# Patient Record
Sex: Female | Born: 1960 | Race: Black or African American | Hispanic: No | Marital: Married | Smoking: Never smoker
Health system: Southern US, Community
[De-identification: ages and names within clinical notes are randomized; demographics above are authoritative.]

## PROBLEM LIST (undated history)

## (undated) DIAGNOSIS — M5136 Other intervertebral disc degeneration, lumbar region: Secondary | ICD-10-CM

## (undated) DIAGNOSIS — K635 Polyp of colon: Secondary | ICD-10-CM

## (undated) DIAGNOSIS — E0590 Thyrotoxicosis, unspecified without thyrotoxic crisis or storm: Secondary | ICD-10-CM

## (undated) DIAGNOSIS — E785 Hyperlipidemia, unspecified: Secondary | ICD-10-CM

## (undated) HISTORY — DX: Hyperlipidemia, unspecified: E78.5

## (undated) HISTORY — DX: Polyp of colon: K63.5

## (undated) HISTORY — PX: CHOLECYSTECTOMY: SHX55

## (undated) HISTORY — DX: Thyrotoxicosis, unspecified without thyrotoxic crisis or storm: E05.90

## (undated) HISTORY — DX: Other intervertebral disc degeneration, lumbar region: M51.36

## (undated) HISTORY — PX: TUBAL LIGATION: SHX77

---

## 1998-05-23 ENCOUNTER — Other Ambulatory Visit: Admission: RE | Admit: 1998-05-23 | Discharge: 1998-05-23 | Payer: Self-pay | Admitting: *Deleted

## 1999-05-08 ENCOUNTER — Ambulatory Visit (HOSPITAL_COMMUNITY): Admission: RE | Admit: 1999-05-08 | Discharge: 1999-05-08 | Payer: Self-pay | Admitting: *Deleted

## 1999-05-08 ENCOUNTER — Encounter: Payer: Self-pay | Admitting: *Deleted

## 1999-05-16 ENCOUNTER — Other Ambulatory Visit: Admission: RE | Admit: 1999-05-16 | Discharge: 1999-05-16 | Payer: Self-pay | Admitting: *Deleted

## 1999-12-03 ENCOUNTER — Ambulatory Visit (HOSPITAL_COMMUNITY): Admission: RE | Admit: 1999-12-03 | Discharge: 1999-12-03 | Payer: Self-pay | Admitting: Family Medicine

## 2000-01-03 ENCOUNTER — Other Ambulatory Visit: Admission: RE | Admit: 2000-01-03 | Discharge: 2000-01-03 | Payer: Self-pay | Admitting: *Deleted

## 2000-01-28 ENCOUNTER — Ambulatory Visit: Admission: AD | Admit: 2000-01-28 | Discharge: 2000-01-28 | Payer: Self-pay | Admitting: *Deleted

## 2000-01-28 ENCOUNTER — Encounter (INDEPENDENT_AMBULATORY_CARE_PROVIDER_SITE_OTHER): Payer: Self-pay | Admitting: *Deleted

## 2000-01-28 ENCOUNTER — Encounter: Payer: Self-pay | Admitting: *Deleted

## 2000-04-10 ENCOUNTER — Ambulatory Visit (HOSPITAL_COMMUNITY): Admission: RE | Admit: 2000-04-10 | Discharge: 2000-04-10 | Payer: Self-pay | Admitting: *Deleted

## 2001-02-26 ENCOUNTER — Ambulatory Visit (HOSPITAL_COMMUNITY): Admission: RE | Admit: 2001-02-26 | Discharge: 2001-02-26 | Payer: Self-pay | Admitting: *Deleted

## 2001-02-26 ENCOUNTER — Encounter: Payer: Self-pay | Admitting: *Deleted

## 2002-02-28 ENCOUNTER — Encounter: Payer: Self-pay | Admitting: *Deleted

## 2002-02-28 ENCOUNTER — Ambulatory Visit (HOSPITAL_COMMUNITY): Admission: RE | Admit: 2002-02-28 | Discharge: 2002-02-28 | Payer: Self-pay | Admitting: *Deleted

## 2003-03-16 ENCOUNTER — Encounter: Payer: Self-pay | Admitting: Family Medicine

## 2003-03-16 ENCOUNTER — Ambulatory Visit (HOSPITAL_COMMUNITY): Admission: RE | Admit: 2003-03-16 | Discharge: 2003-03-16 | Payer: Self-pay | Admitting: Family Medicine

## 2004-04-10 ENCOUNTER — Ambulatory Visit (HOSPITAL_COMMUNITY): Admission: RE | Admit: 2004-04-10 | Discharge: 2004-04-10 | Payer: Self-pay | Admitting: Family Medicine

## 2005-03-25 ENCOUNTER — Ambulatory Visit (HOSPITAL_COMMUNITY): Admission: RE | Admit: 2005-03-25 | Discharge: 2005-03-25 | Payer: Self-pay | Admitting: Family Medicine

## 2005-04-14 ENCOUNTER — Encounter: Admission: RE | Admit: 2005-04-14 | Discharge: 2005-04-14 | Payer: Self-pay | Admitting: Surgery

## 2005-05-22 ENCOUNTER — Encounter (INDEPENDENT_AMBULATORY_CARE_PROVIDER_SITE_OTHER): Payer: Self-pay | Admitting: Specialist

## 2005-05-22 ENCOUNTER — Ambulatory Visit (HOSPITAL_COMMUNITY): Admission: RE | Admit: 2005-05-22 | Discharge: 2005-05-23 | Payer: Self-pay | Admitting: Surgery

## 2005-06-04 ENCOUNTER — Ambulatory Visit (HOSPITAL_COMMUNITY): Admission: RE | Admit: 2005-06-04 | Discharge: 2005-06-04 | Payer: Self-pay | Admitting: Family Medicine

## 2005-07-30 ENCOUNTER — Other Ambulatory Visit: Admission: RE | Admit: 2005-07-30 | Discharge: 2005-07-30 | Payer: Self-pay | Admitting: Family Medicine

## 2005-08-18 ENCOUNTER — Encounter: Admission: RE | Admit: 2005-08-18 | Discharge: 2005-08-18 | Payer: Self-pay | Admitting: Family Medicine

## 2006-03-20 DIAGNOSIS — D259 Leiomyoma of uterus, unspecified: Secondary | ICD-10-CM | POA: Insufficient documentation

## 2006-05-07 ENCOUNTER — Ambulatory Visit: Payer: Self-pay | Admitting: Family Medicine

## 2006-08-28 ENCOUNTER — Other Ambulatory Visit: Admission: RE | Admit: 2006-08-28 | Discharge: 2006-08-28 | Payer: Self-pay | Admitting: Family Medicine

## 2006-09-03 ENCOUNTER — Encounter: Admission: RE | Admit: 2006-09-03 | Discharge: 2006-09-03 | Payer: Self-pay | Admitting: Family Medicine

## 2006-11-01 IMAGING — CR DG SHOULDER 2+V*L*
3 series · 3 of 3 positions shown · non-contrast
Comparison: none

CLINICAL DATA: Pain in both shoulders.
 RIGHT SHOULDER ? 3 VIEW:
 There is no evidence of fracture or dislocation.  There is no evidence of arthropathy or other focal bone abnormality.  Soft tissues are unremarkable.

[view not recorded (1 of 3)]
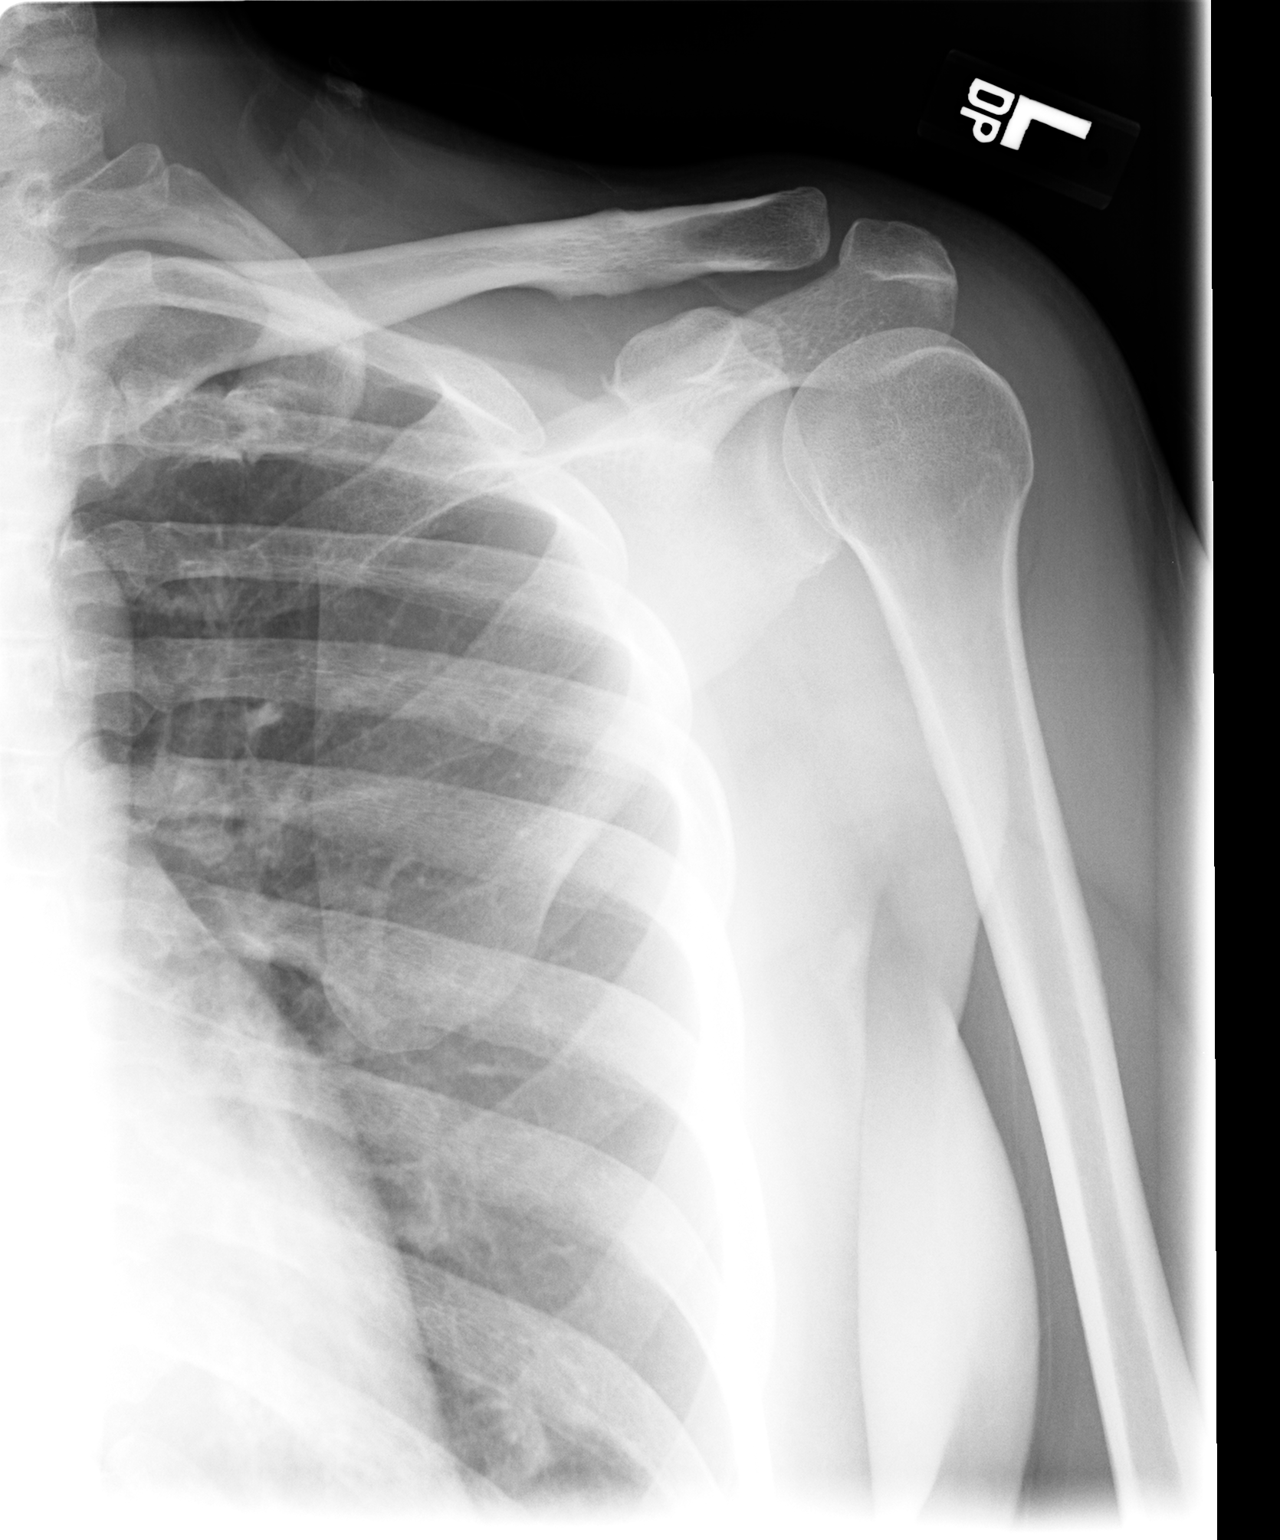

[view not recorded (2 of 3)]
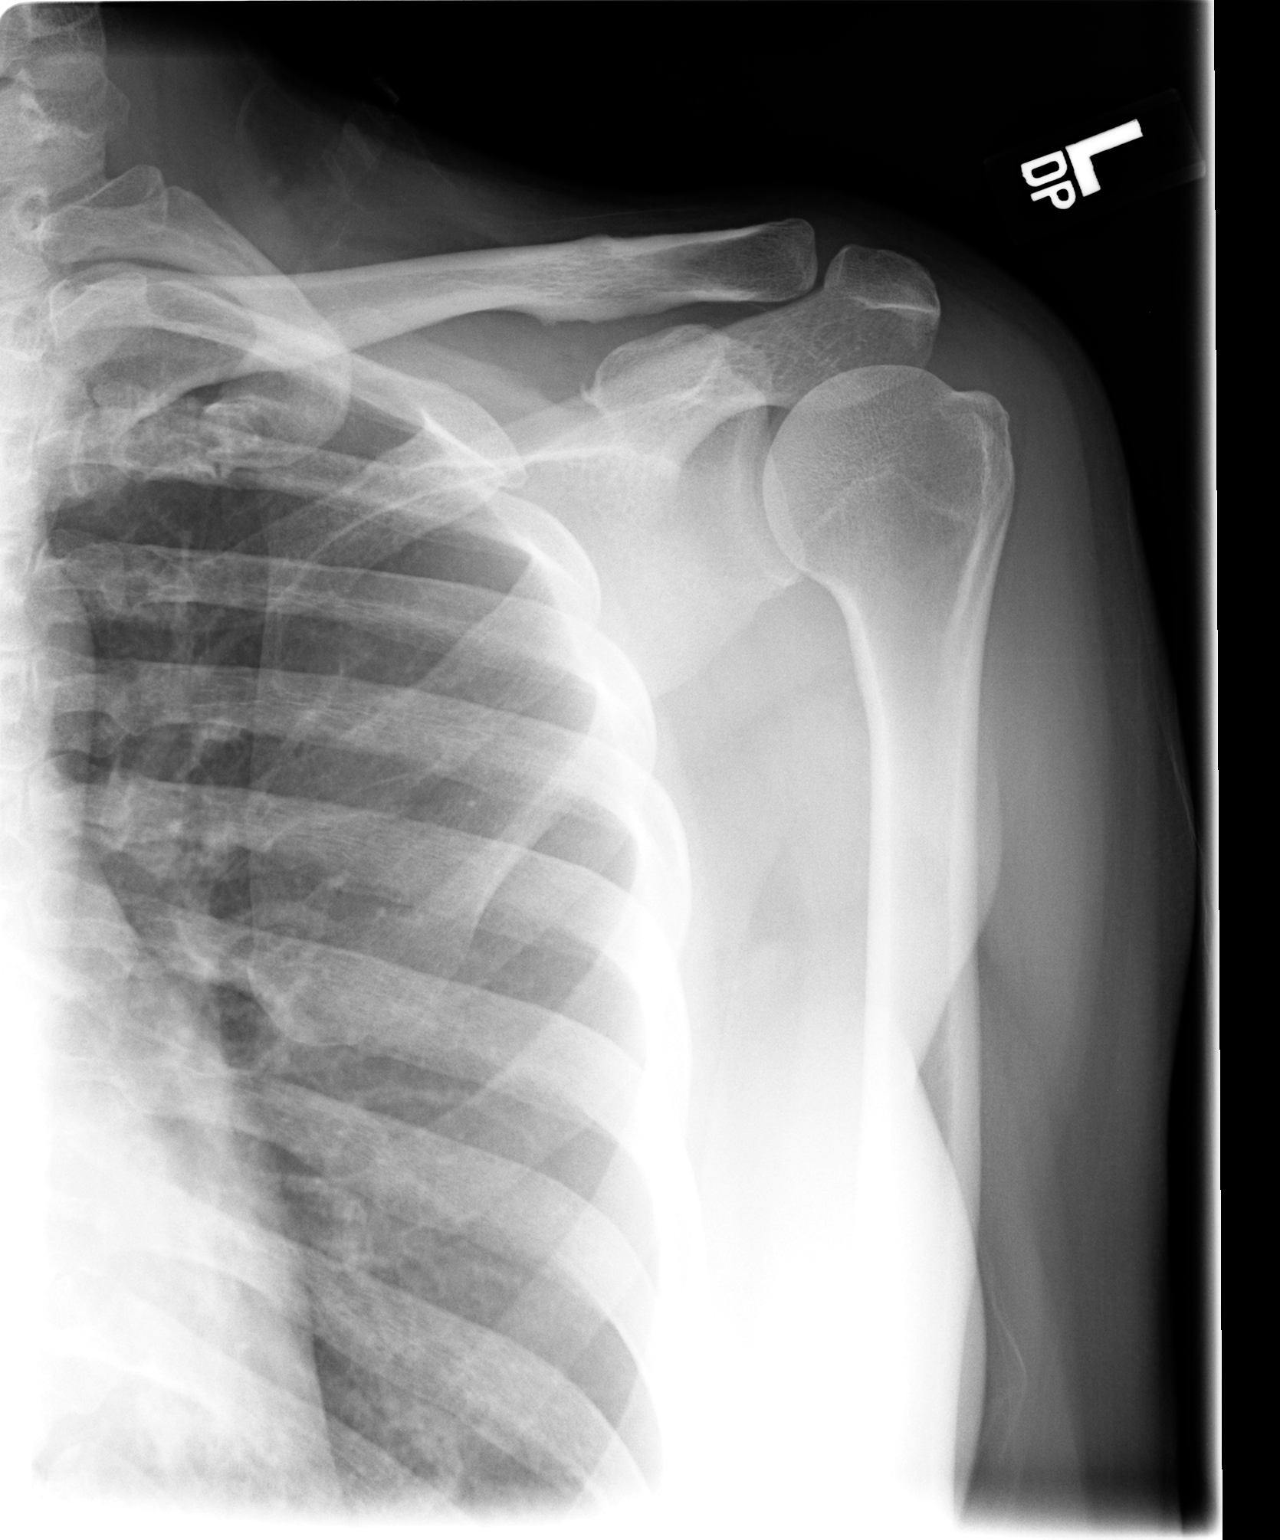

[view not recorded (3 of 3)]
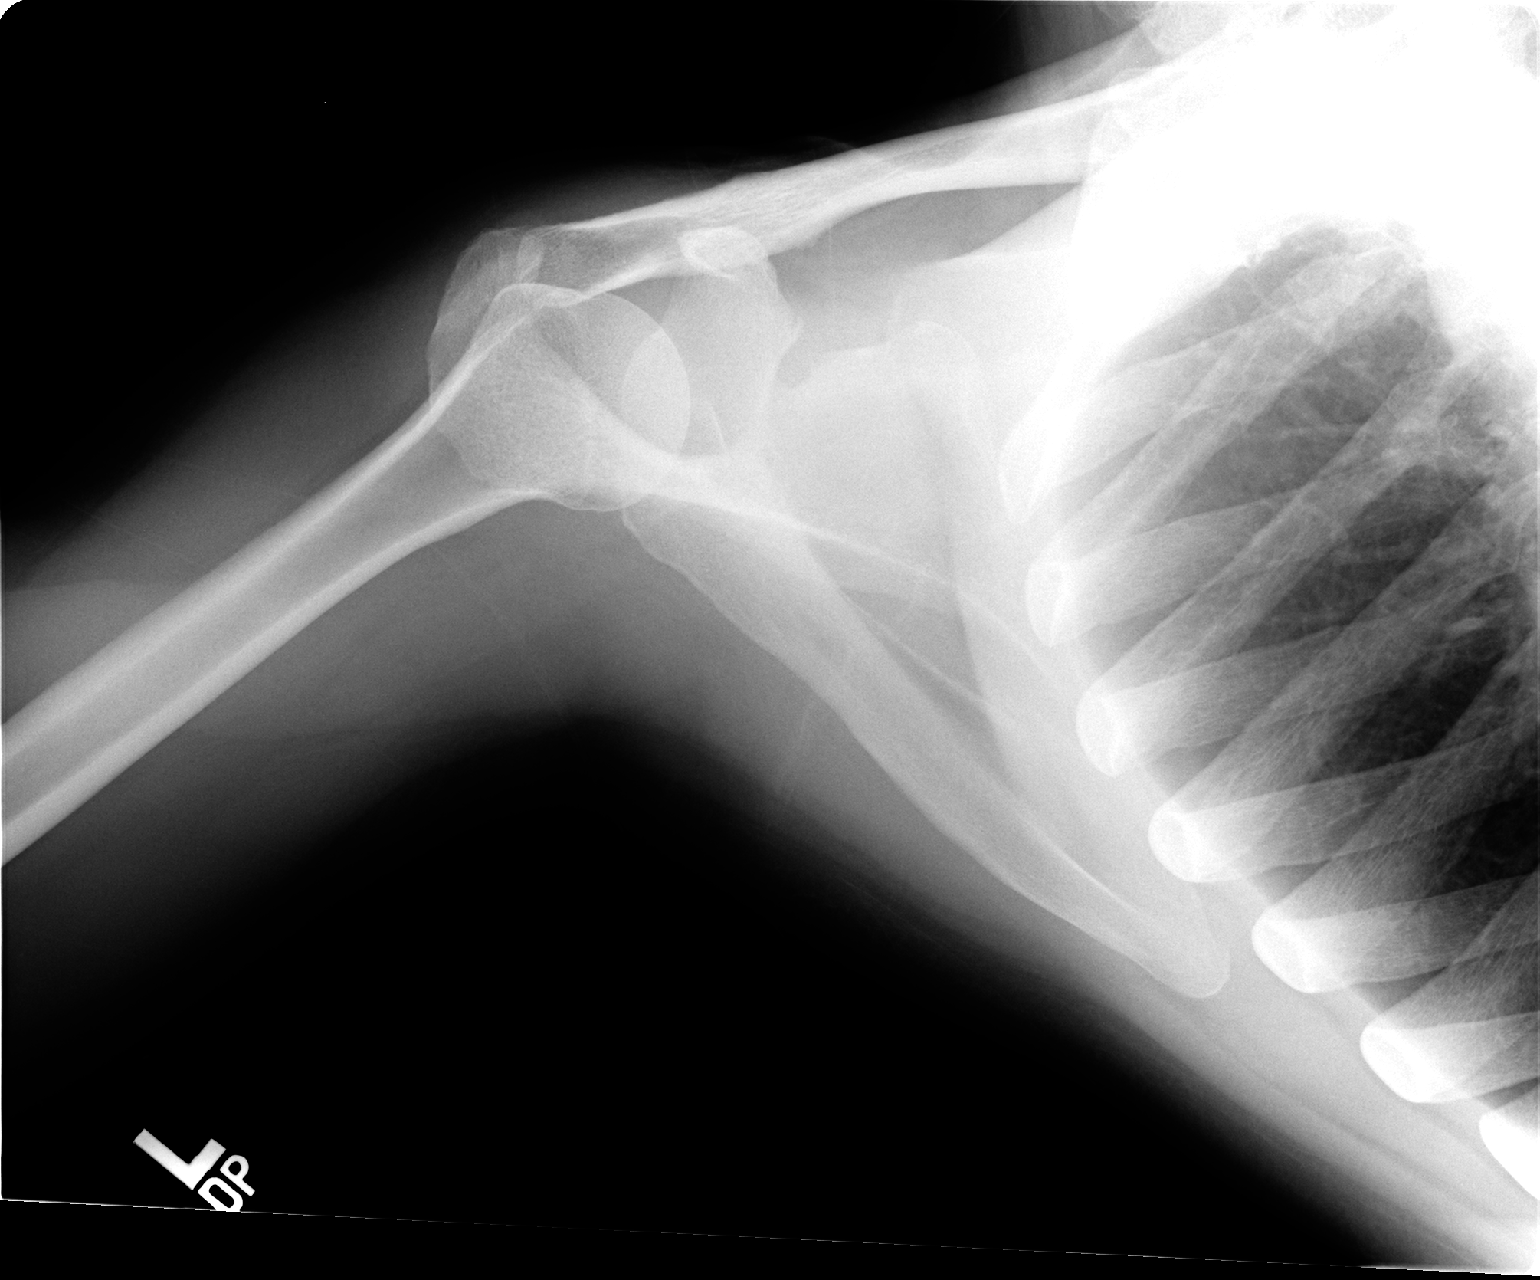

[3 of 3 positions shown; findings below may reference images not displayed]

IMPRESSION: Negative.
 LEFT SHOULDER ? 3 VIEW:
 There is no evidence of fracture or dislocation.  There is no evidence of arthropathy or other focal bone abnormality.  Soft tissues are unremarkable.
IMPRESSION: Negative.

## 2006-11-01 IMAGING — CR DG SHOULDER 2+V*R*
3 series · 3 of 3 positions shown · non-contrast
Comparison: none

CLINICAL DATA: Pain in both shoulders.
 RIGHT SHOULDER ? 3 VIEW:
 There is no evidence of fracture or dislocation.  There is no evidence of arthropathy or other focal bone abnormality.  Soft tissues are unremarkable.

[view not recorded (1 of 3)]
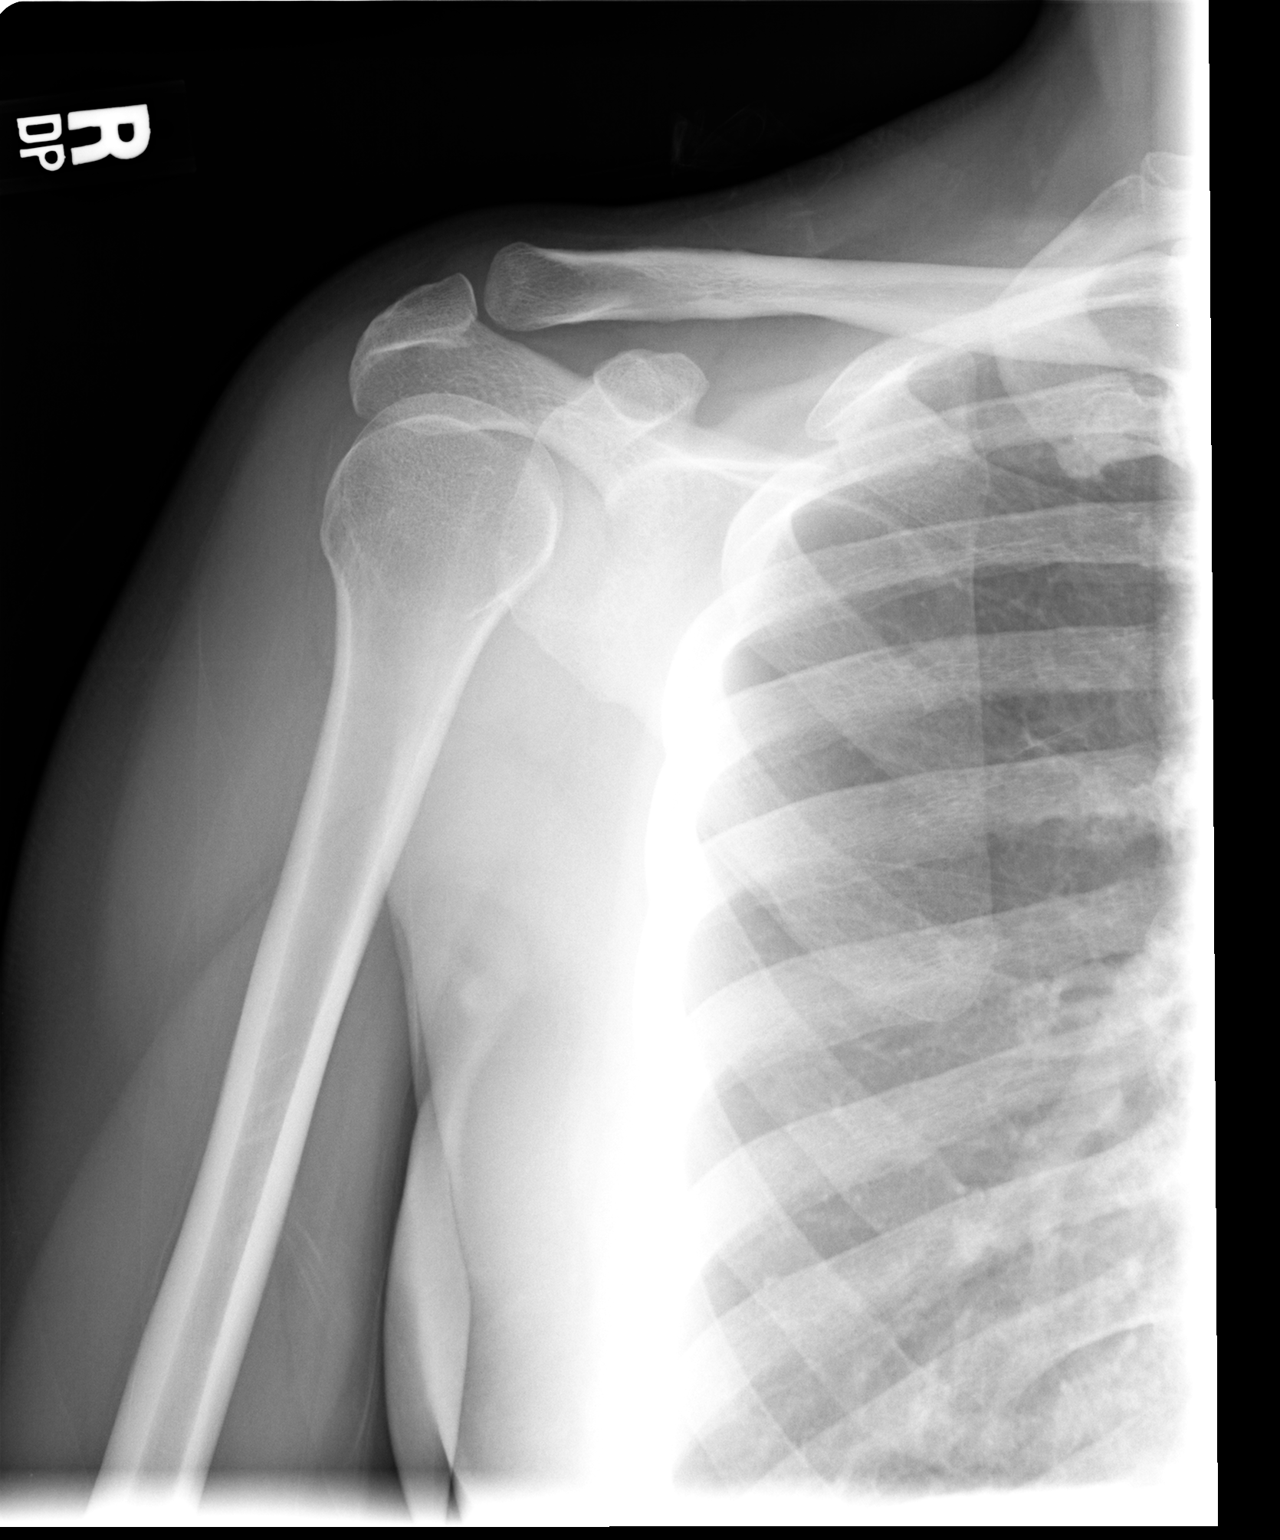

[view not recorded (2 of 3)]
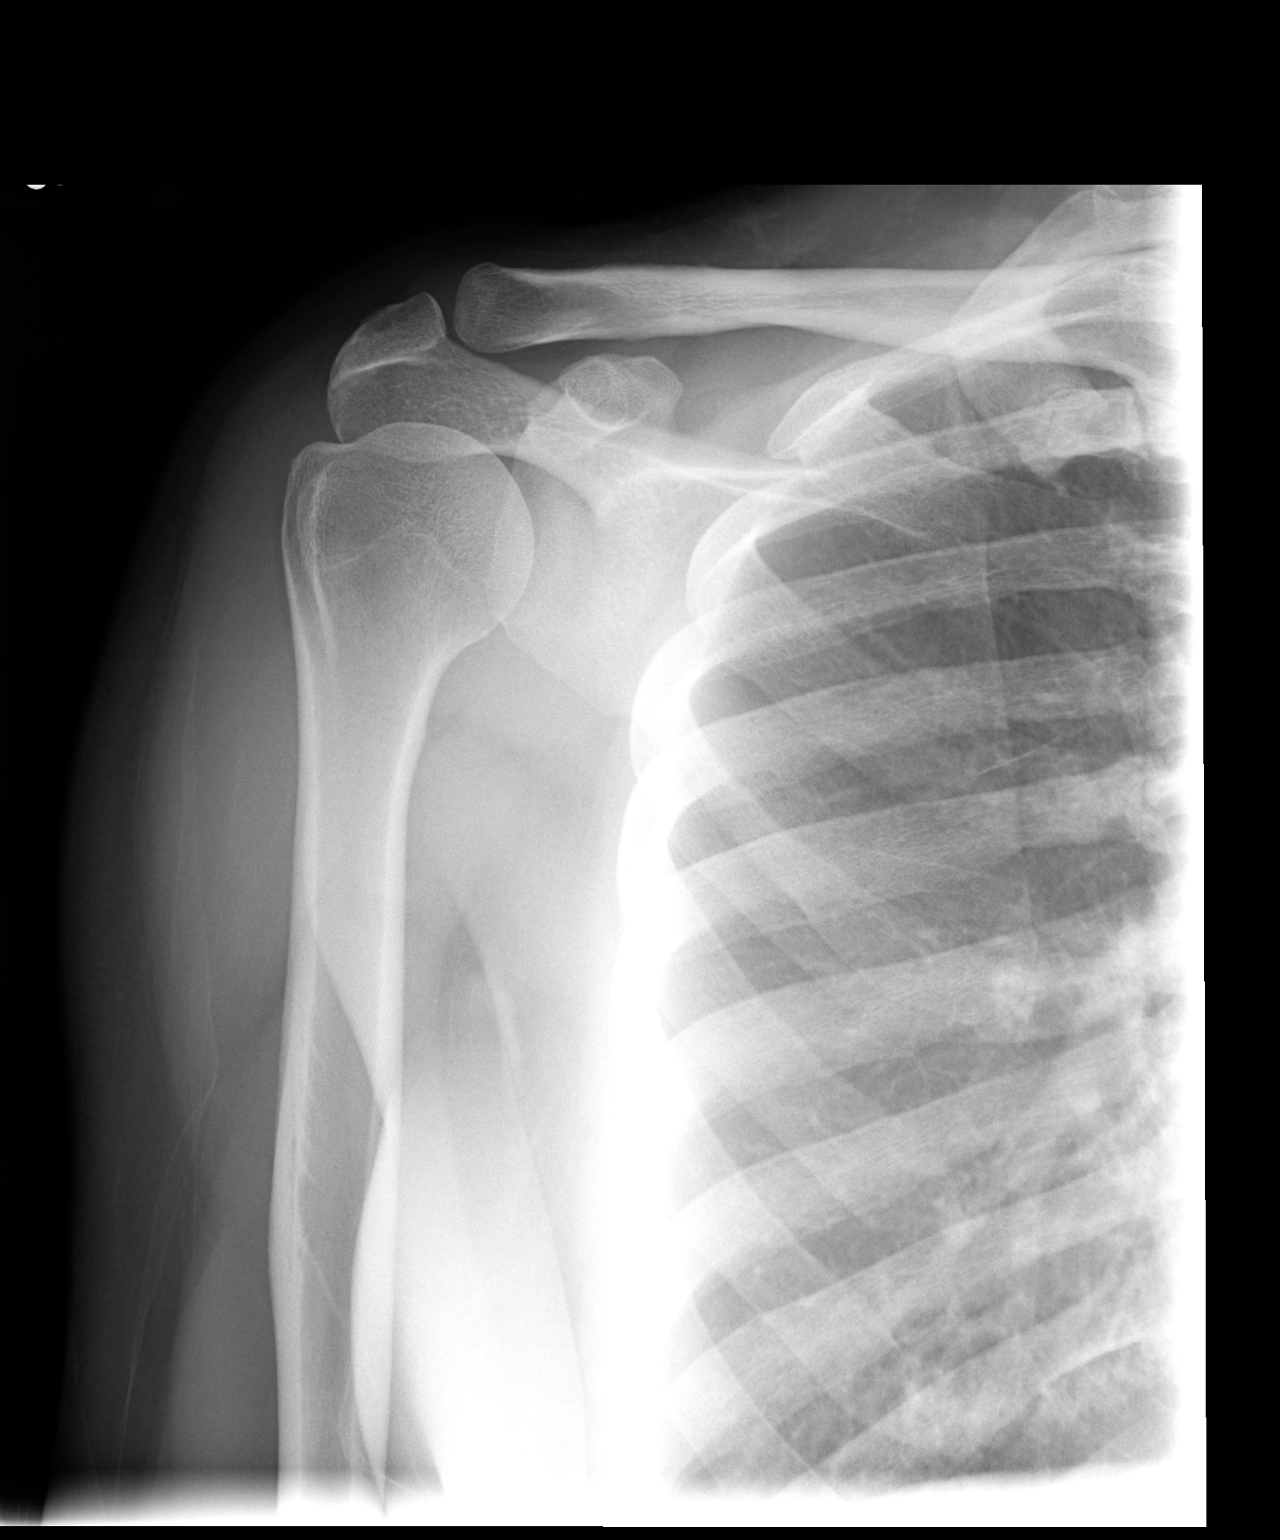

[view not recorded (3 of 3)]
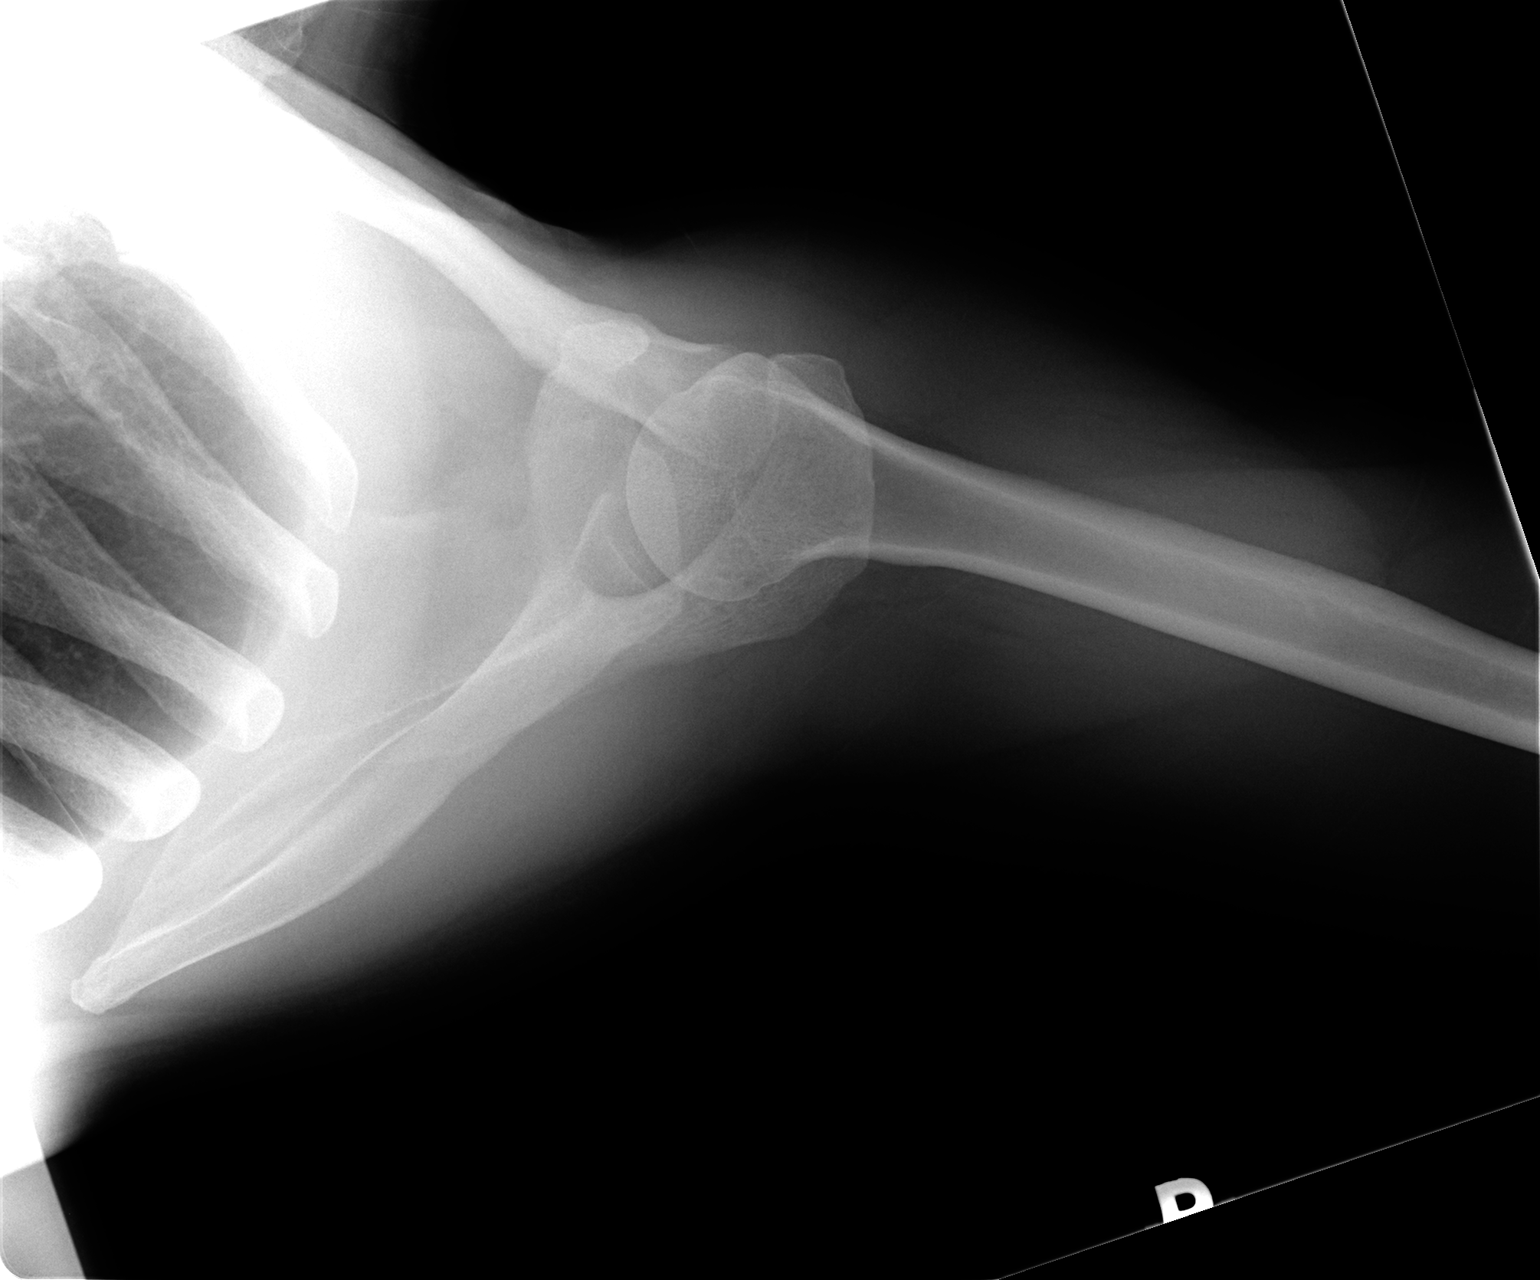

[3 of 3 positions shown; findings below may reference images not displayed]

IMPRESSION: Negative.
 LEFT SHOULDER ? 3 VIEW:
 There is no evidence of fracture or dislocation.  There is no evidence of arthropathy or other focal bone abnormality.  Soft tissues are unremarkable.
IMPRESSION: Negative.

## 2007-07-06 ENCOUNTER — Ambulatory Visit: Payer: Self-pay | Admitting: Family Medicine

## 2007-10-11 ENCOUNTER — Encounter: Admission: RE | Admit: 2007-10-11 | Discharge: 2007-10-11 | Payer: Self-pay | Admitting: Family Medicine

## 2008-03-12 ENCOUNTER — Emergency Department (HOSPITAL_COMMUNITY): Admission: EM | Admit: 2008-03-12 | Discharge: 2008-03-12 | Payer: Self-pay | Admitting: Emergency Medicine

## 2008-03-14 ENCOUNTER — Ambulatory Visit: Payer: Self-pay | Admitting: Family Medicine

## 2008-03-17 ENCOUNTER — Ambulatory Visit: Payer: Self-pay | Admitting: Family Medicine

## 2008-05-18 ENCOUNTER — Encounter: Admission: RE | Admit: 2008-05-18 | Discharge: 2008-05-18 | Payer: Self-pay | Admitting: Sports Medicine

## 2008-12-28 ENCOUNTER — Ambulatory Visit: Payer: Self-pay | Admitting: Family Medicine

## 2009-01-18 ENCOUNTER — Encounter: Admission: RE | Admit: 2009-01-18 | Discharge: 2009-01-18 | Payer: Self-pay | Admitting: Family Medicine

## 2009-01-26 ENCOUNTER — Ambulatory Visit: Payer: Self-pay | Admitting: Family Medicine

## 2009-05-28 ENCOUNTER — Ambulatory Visit: Payer: Self-pay | Admitting: Family Medicine

## 2009-12-11 ENCOUNTER — Ambulatory Visit: Payer: Self-pay | Admitting: Family Medicine

## 2010-01-09 ENCOUNTER — Ambulatory Visit: Payer: Self-pay | Admitting: Physician Assistant

## 2010-01-21 ENCOUNTER — Encounter: Admission: RE | Admit: 2010-01-21 | Discharge: 2010-01-21 | Payer: Self-pay | Admitting: Family Medicine

## 2010-01-23 ENCOUNTER — Ambulatory Visit: Payer: Self-pay | Admitting: Family Medicine

## 2010-01-23 ENCOUNTER — Other Ambulatory Visit: Admission: RE | Admit: 2010-01-23 | Discharge: 2010-01-23 | Payer: Self-pay | Admitting: Family Medicine

## 2010-02-17 HISTORY — PX: COLONOSCOPY: SHX174

## 2010-02-21 LAB — HM COLONOSCOPY

## 2010-03-07 ENCOUNTER — Ambulatory Visit: Payer: Self-pay | Admitting: Physician Assistant

## 2010-04-29 ENCOUNTER — Ambulatory Visit: Payer: Self-pay | Admitting: Family Medicine

## 2010-07-16 ENCOUNTER — Ambulatory Visit: Payer: Self-pay | Admitting: Family Medicine

## 2010-08-20 ENCOUNTER — Ambulatory Visit: Payer: Self-pay | Admitting: Family Medicine

## 2011-01-06 ENCOUNTER — Ambulatory Visit: Payer: Self-pay | Admitting: Family Medicine

## 2011-02-17 ENCOUNTER — Encounter: Payer: Self-pay | Admitting: Family Medicine

## 2011-02-17 DIAGNOSIS — S92302A Fracture of unspecified metatarsal bone(s), left foot, initial encounter for closed fracture: Secondary | ICD-10-CM

## 2011-02-17 DIAGNOSIS — S39012A Strain of muscle, fascia and tendon of lower back, initial encounter: Secondary | ICD-10-CM

## 2011-02-17 DIAGNOSIS — M5126 Other intervertebral disc displacement, lumbar region: Secondary | ICD-10-CM

## 2011-02-17 DIAGNOSIS — H00019 Hordeolum externum unspecified eye, unspecified eyelid: Secondary | ICD-10-CM

## 2011-02-17 DIAGNOSIS — D259 Leiomyoma of uterus, unspecified: Secondary | ICD-10-CM

## 2011-03-07 NOTE — Op Note (Signed)
[facility]  Patient:    Sandra Valentine, Sandra Valentine                       MRN: [id number] Proc. Date: 04/10/00 Adm. Date:  [id number] Attending:  Deniece Ree                           Operative Report  PREOPERATIVE DIAGNOSIS:       Multiparity, desires permanent sterilization.  POSTOPERATIVE DIAGNOSIS:      Multiparity, desires permanent sterilization.  OPERATION:                    Laparoscopic bilateral tubal cauterization with tubal resection.  SURGEON:                      Deniece Ree, M.D.  ANESTHESIA:                   General.  ESTIMATED BLOOD LOSS:         Less than 25 cc.  COMPLICATIONS:                None.  DISPOSITION:                  The patient tolerated the procedure well and returned to the recovery room in satisfactory condition.  DESCRIPTION OF PROCEDURE:     The patient was taken to the operating room, prepped and draped in the usual fashion for laparoscopic surgery.  A speculum was placed in the vagina, following which the anterior lip of the cervix was then grasped with a Christella Hartigan tenaculum.  The acorn uterine manipulating cannula was then put into place.  A subumbilical incision was then made, following which the Veress needle was introduced through this incision and approximately 3 L of carbon dioxide was infused without difficulty.  The laparoscopic trocar was placed through this incision, following which the laparoscope was placed through its sleeve.  Visualization of the pelvic organs came into view.  The uterus, tubes and ovaries all appeared to be within normal limits with the exception of some flimsy type adhesions around the right tube and ovary.  The right tube was then grasped 5 mm proximal to the right cornu and cauterized for approximately 2-3 cm in length.  This was done likewise on the left side. Following this, the cauterized tubal areas were then cut in two locations. Hemostasis remained present.  Sponge,  needle and instrument counts were correct x 2.  At this point, the carbon dioxide was allowed to escape from the abdominal cavity without any problem.  The incision was then closed, first with a deep interrupted stitch, followed by a subcuticular stitch using 4-0 Vicryl.  The procedure was terminated.  The patient tolerated the procedure well and returned to the recovery room in satisfactory condition.  The patient is to be discharged when fully alert.  She has been instructed on the possible complications and care following this type of surgery.  She has been told to return to my office in four weeks for follow-up evaluation or to call me prior to that time should any problems arise. DD:  04/10/00 TD:  04/13/00 Job: [id number]

## 2011-03-07 NOTE — Op Note (Signed)
NAMECAREL, SCHNEE                ACCOUNT NO.:  [id number]   MEDICAL RECORD NO.:  [id number]          PATIENT TYPE:  AMB   LOCATION:  DAY                          FACILITY:  [facility]   PHYSICIAN:  Thornton Park. Daphine Deutscher, MD  DATE OF BIRTH:  26-May-1961   DATE OF PROCEDURE:  05/22/2005  DATE OF DISCHARGE:                                 OPERATIVE REPORT   PREOPERATIVE DIAGNOSES:  1.  Recurrent bouts of upper abdominal pain.  2.  Chronic cholecystitis.   POSTOPERATIVE DIAGNOSIS:  1.  Chronic cholecystitis.  2.  Possible adenomyosis.   PROCEDURE:  Lap chole, intraoperative cholangiogram.   SURGEON:  Thornton Park. Daphine Deutscher, M.D.   ASSISTANT:  Ollen Gross. Carolynne Edouard, M.D.   ANESTHESIA:  Gen endotracheal.   DRAINS:  None.   ESTIMATED BLOOD LOSS:  25 mL.   DESCRIPTION OF PROCEDURE:  Getsemani Meneely is a 50 year old lady who upon  workup of right upper quadrant pain was found to have polyps or adenomyosis  of the gallbladder. She was concerned and wanted to go ahead and get her  gallbladder out. She was taken to room one, given general anesthesia. The  abdomen was prepped with Betadine and draped sterilely. A longitudinal  incision was made down to the umbilicus through which I introduced a Hasson  cannula and entered the abdomen without difficulty. The abdomen was  insufflated. Three trocars were placed in the upper abdomen. The gallbladder  was grasped, elevated, whereupon you could see the adhesions from the  duodenum stuck all along the infundibulum indicating chronic cholecystitis.  These were stripped away using the scissors and dissector and I dissected  free Calot's triangle. I got around that, dissected free the artery,  put a  clip on the artery, clip on the gallbladder, incised the cystic duct and did  a dynamic cholangiogram which showed a fairly reasonably long cystic duct  with intrahepatic filling of two ducts and no stones and distal flow into  the duodenum. There is also some backwash up  into the pancreatic duct. I  then triple clipped the cystic duct, divided it, triple clipped the cystic  artery and divided it and found a little tiny bridging artery between the  two, cystic duct and cystic artery but controlled this by staying right  along the gallbladder using the hook electrocautery. I did not enter the  gallbladder but got it out. I encountered a posterior wall vessel which I  put several clips on as I came up in the fundus and it was detached. I went  down and inspected and irrigated and no bleeding or bile leaks were noted. I  did have a little bit of bleeding from the adhesions that were stripped away  initially and I used a 30 degree scope to look down at those and that seemed  to be controlled with touches of the grasper with Bovie on low. The  gallbladder was brought out through the umbilicus using a bag and then the  umbilical defect was repaired under laparoscopic vision  with two sutures of 0 Vicryl. All port sites were injected  with Marcaine and  then the wounds were closed with 4-0 Vicryl with Benzoin and Steri-Strips.  The patient seemed to tolerate the procedure well and was taken to the  recovery room in satisfactory condition.       MBM/MEDQ  D:  05/22/2005  T:  05/22/2005  Job:  [id number]

## 2011-03-07 NOTE — H&P (Signed)
[facility]  Patient:    Sandra Valentine, Sandra Valentine                       MRN: [id number] Adm. Date:  [id number] Attending:  Deniece Ree                         History and Physical  HISTORY:                      The patient is a 50 year old multiparous female who is desirous of permanent sterilization.  All of her questions were answered to her satisfaction.  She was presented and understands the possible complications.  She also understands the care following this procedure.  She understands that this tubal ligation is intended to be permanent.  However, this cannot be guaranteed.  PAST MEDICAL HISTORY:         The patient has had four vaginal deliveries and a D&C.  She denies any other problems.  PHYSICAL EXAMINATION:  GENERAL:                      Well-developed, well-nourished female in no acute distress.  HEENT:                        Within normal limits.  NECK:                         Supple.  BREASTS:                      Without masses, tenderness or discharge.  LUNGS:                        Clear to auscultation and percussion.  HEART:                        Normal sinus rhythm without murmurs, rubs, or gallops.  ABDOMEN:                      Benign.  EXTREMITIES:                  Within normal limits.  NEUROLOGIC;                   Within normal limits.  PELVIC:                       External genitalia and BUS within normal limits. The vagina is clear.  The cervix is nontender.  The uterus is normal size, shape and consistency.  Adnexa benign.  DIAGNOSIS:                    Multiparity, desires permanent sterilization.  PLAN:                         Laparoscopic bilateral tubal ligation and tubal resection. DD:  04/10/00 TD:  04/13/00 Job: [id number]

## 2011-03-10 ENCOUNTER — Ambulatory Visit: Payer: Self-pay | Admitting: Family Medicine

## 2011-04-28 ENCOUNTER — Ambulatory Visit (INDEPENDENT_AMBULATORY_CARE_PROVIDER_SITE_OTHER): Payer: Self-pay | Admitting: Family Medicine

## 2011-04-28 ENCOUNTER — Encounter: Payer: Self-pay | Admitting: Family Medicine

## 2011-04-28 DIAGNOSIS — N951 Menopausal and female climacteric states: Secondary | ICD-10-CM

## 2011-04-28 DIAGNOSIS — E7800 Pure hypercholesterolemia, unspecified: Secondary | ICD-10-CM

## 2011-04-28 DIAGNOSIS — E119 Type 2 diabetes mellitus without complications: Secondary | ICD-10-CM

## 2011-04-28 NOTE — Patient Instructions (Signed)
Cholesterol Control  Modern scientists have known for a long time that cholesterol levels in the body are related to coronary heart disease and that diet affects these levels. The following material helps to explain this relationship and discusses what you can do to help keep your heart healthy. Not all cholesterol is bad. Low-density lipoprotein (LDL) cholesterol is the "bad" cholesterol which may cause fatty deposits to build up inside your arteries. High-density lipoprotein (HDL) cholesterol is "good". It helps to remove the "bad" LDL cholesterol from your blood. Cholesterol is a very important risk factor for coronary heart disease. Other risk factors are high blood pressure, smoking, stress, heredity, and weight.  The heart muscle gets its supply of blood through the coronary arteries. If your LDL ("bad") cholesterol is high and your HDL ("good") cholesterol is low, you have a risk factor for fatty deposits accumulating in your coronary arteries (a vessel providing blood to the heart). This leaves less room through which blood can flow. Without sufficient blood and the oxygen, the heart muscle cannot function properly, and you may feel chest pains (called angina pectoris). When a coronary artery closes up entirely, a part of the heart muscle may die (called a myocardial infarction).  CHECKING CHOLESTEROL  When your caregiver sends your blood to a lab to be analyzed for cholesterol, they may do a complete lipid (fat) profile. With this test the total amount of cholesterol, as well as levels of LDL and HDL, are determined. The chart below describes what the numbers should be:  Test  Goal    Total Cholesterol  Less than 200 mg/dl    LDL "bad cholesterol"  Less than 160 mg/dl for those at low risk for heart disease  Less than 130 mg/dl for those at intermediate risk for heart disease.  Less than 100 mg/dl for those with diabetes or at high risk for heart disease   Less than 70 mg/dl for those at very high risk for heart disease    HDL "good cholesterol"  Women: Greater than 50 mg/dl  Men: Greater than 40 mg/dl    Triglycerides  Less than 150 mg/dl    Reference: AmericanHeart.org/Numbersthatcount 11/03/06  CONTROLLING CHOLESTEROL WITH DIET  Although such factors as exercise and life-style are important, the "first line of attack" is diet. That is because certain foods are known to raise cholesterol and others to lower it. So the goal for most Americans is to balance foods for their effect on cholesterol and, even more important, to replace saturated fat with other types of fat, such as monounsaturated and polyunsaturated fats, and omega-3 fatty acids.  The average American takes in about 500 to 600 milligrams (mg) of cholesterol and about 40 grams (g) of saturated fat daily. Ideally, these figures should be reduced to 300 mg of cholesterol and 15 to 17 g of saturated fat, or even lower in people who have coronary artery disease or a history of heart attack. But that does not mean you have to sacrifice all your favorite foods. Today, as the table at the end of this document shows, there are good-tasting, low-fat, low-cholesterol substitutes for most of the things you like to eat. Which foods should you choose? Choose low-fat or non-fat alternatives. Choose round or loin cuts of red meat as these types of cuts are lowest in fat and cholesterol. Chicken (without the skin), fish, veal and ground turkey breast are excellent choices. Eliminate fatty meats like hotdogs and salami. Even shellfish have little or no saturated   fat and so, despite their high cholesterol content, are allowable in moderation. When you eat lean meat, poultry, or fish, have a 3 oz. portion.   For baking and cooking, oils are an excellent substitute for butter. The monounsaturated oils are of particular benefit since it is believed they lower LDL (the bad cholesterol) but raise HDL. The oils you should avoid entirely are the saturated tropical oils such as coconut and palm.    Remember to eat liberally from food groups that are naturally free of cholesterol and saturated fat, including fish, fruit, vegetables, beans, grains (barley, rice, couscous, bul-gur wheat), and pasta (without cream sauces).    IDENTIFYING FOODS THAT LOWER CHOLESTEROL . . .  Soluble fiber found in fruits such as apples; vegetables such as broccoli, potatoes, and carrots; legumes such as beans, peas, and lentils; and grains such as barley may lower your cholesterol. Foods fortified with plant sterols, or phytosterol, may also lower cholesterol. You should eat at least 2g per day of these foods for a cholesterol lowering effect.    How can you identify low-cholesterol, low-fat foods at the supermarket? The key is to read package labels. Select cheeses that have only 2-3 g saturated fat per ounce. Use a heart healthy tub margarine free of partially hydrogenated oil such as Promise or Smart Balance. When buying baked goods (cookies, crackers), avoid partially hydrogenated oils. Breads and muffins should be made from whole grains (whole wheat or whole oat flour, instead of just "flour" or "enriched flour"). Buy non-creamy canned soups with reduced salt and no added fats.    FOOD PREPARATION TECHNIQUES . . .  Never deep fry. If you must fry, either stir fry, which uses very little fat, or use the non-stick cooking sprays like Pam. When possible, broil, bake, or roast meats, and steam vegetables. Instead of dressing vegetables with butter or margarine, use lemon and herbs, applesauce and cinnamon (for squash and sweet potatoes), non-fat yogurt, salsa, and low-fat dressings for salads.     See the following table for food, cholesterol, and fat information.  FOODS HIGH IN CHOLESTEROL AND/OR SATURATED FAT  LOW CHOLESTEROL/LOW-FAT SUBSTITUTES      Cholesterol  (milligrams)  Saturated Fat  (grams)    Cholesterol  (milligrams)  Saturated Fat  (grams)    Steak, marbled (3 oz)  90  11  Steak, lean (3 oz)  50  4    Hamburger (3 oz)  80  7  Hamburger, lean (3 oz)  50  5    Ham (3 oz)  53  6  Ham, lean cut (3 oz)  35  2.4    Chicken, with skin   (3 oz)      Chicken, skin removed, 3 oz        Dark meat  90  4  Dark meat  80  2    Light meat  80  2.5  Light meat  70  1    Egg (1 Large)  200-300  1.7  Egg substitutes ("Eggbeaters")  0  0    Whole Milk (1 cup)  33  5  Low-fat milk (2%)   (1 cup)  18  3          Low-fat milk (1%)   (1 cup)  10  1.5          Skim milk (1 cup)  trace  0.3    Hard Cheese (1 oz)  30  6  Skim milk cheese   (  1 oz)  16  2-3    Cottage Cheese    (1 cup) (4% fat)  35  6.5  Low-fat cottage cheese (1 cup) (1% fat)  10  1.5    Ice cream (1 cup)  60  9  Sherbet (1 cup)  14  2.5          Non-fat frozen yogurt (1 cup)  0  0.3          Frozen fruit bars  0  trace    Whipped cream   (1 tbsp)  20  3.5  Non-dairy whipped toppings (1 tbsp)  trace  1    Mayonnaise   (1 tbsp)  5  2  Low-fat mayonnaise (1 tbsp)  5  1    Butter (1 tbsp)  30  7  Extra light margarines ( tbsp)  0  1    Oils (1 tbsp)      Oils (1 tbsp)        Saturated      Monounsaturated        Coconut oil  0  11.8  Olive  0  1.8          Polyunsaturated              Corn  0  1.7          Safflower  0  1.2          Sunflower  0  1.4          Soybean  0  2.4    Document Released: 10/06/2005 Document Re-Released: 08/03/2007  [employer] Patient Information 2011 [employer].

## 2011-04-28 NOTE — Progress Notes (Signed)
Patient presents with complaints of hot flashes. Hot flashes at night too, but not actual night sweats.  Uses fan at night.   LMP was 9/08.  Interferes with her sleep.  Has been gradually getting worse, especially in the last 2 years.  Tried soy pills, soy milk. Hasn't tried other herbal products.  Patient is requesting blood test (and was upset it was denied/not offered to her in past)  Patient is a diabetic, with last A1c in November of 6.8, although have been higher in the past.  Fasting sugars are 120-130, often lower later in the day.  Was rx'd Kombiglyze in 01/2010 with significant side effects, including abdominal pain.  Admits to having had a significant rift with Selena Batten. Refuses to take diabetes medications.  Admits that she hasn't been doing as well recently as she was prior to last A1c in taking care of herself and her diabetes.  Hasn't been walking due to the eat.  Trying to watch carbs and sweets in her diet.  Hyperlipidemia--not on a low cholesterol diet, because she states she was never told what not to eat.  Had sausage and egg for breakfast.  Has met with a nutritionist here in the office before. Last LDL was >100; patient states she was never offered medication.  She realizes that her current diet isn't low in cholesterol, and would like to work on it further before starting a medication.  Past Medical History  Diagnosis Date  . DDD (degenerative disc disease), lumbar   . Impaired fasting glucose 03-2003  . Diabetes mellitus   . Sessile colonic polyp   . Hyperlipidemia    Past Surgical History  Procedure Date  . Cholecystectomy   . Tubal ligation    No current outpatient prescriptions on file prior to visit.   No Known Allergies  ROS:  Denies significant weight changes, polydipsia, polyuria, paresthesias.  + hot flashes and insomnia.  Denies any current GI or GU complaints or other concerns.  PHYSICAL EXAM: BP 128/60  Pulse 88  Ht 5\' 4"  (1.626 m)  Wt 131 lb (59.421 kg)  BMI  22.49 kg/m2 Well developed, pleasant female, who at times was slightly argumentative--clearly upset about her previous relationship with the PA prior to me Neck: no lymphadenopathy or thyromegaly Heart: regular rate and rhythm without murmur Lungs: clear bilaterally Extremities: no edema Skin: no rash  ASSESSMENT/PLAN: 1. Menopausal symptom    2. Type II or unspecified type diabetes mellitus without mention of complication, not stated as uncontrolled  Hemoglobin A1c, Glucose, random  3. Pure hypercholesterolemia  Lipid panel    Menopausal symptoms--discussed risks and benefits of HRT.  Would like to try Estroven first.  If ineffective, once she has gotten her yearly mammogram, she can call for combination Rx HRT (ie Prempro).  Last mammo 01/2010.  I don't feel the need to check hormone levels, given that she hasn't had a period in over 3 years--FSH will be high. Pt understands and agrees  DM--borderline control in past.  Doesn't want to be on medications.  Due for A1c.  Admits to not wanting medications, but further trial of diet and exercise--consider this a "baseline" value. Hyperlipidemia--low cholesterol diet reviewed, handout given.  Return for fasting labs  Fasting glucose, A1c, lipids--as a baseline.  Pt wants to use this as a baseline, and modify her diet, start exercising again, and re-check in 3-4 months.

## 2011-07-29 DIAGNOSIS — Z0279 Encounter for issue of other medical certificate: Secondary | ICD-10-CM

## 2012-01-09 ENCOUNTER — Other Ambulatory Visit: Payer: Self-pay | Admitting: Family Medicine

## 2012-01-09 DIAGNOSIS — Z1231 Encounter for screening mammogram for malignant neoplasm of breast: Secondary | ICD-10-CM

## 2012-01-13 ENCOUNTER — Encounter: Payer: Self-pay | Admitting: Internal Medicine

## 2012-01-23 ENCOUNTER — Other Ambulatory Visit: Payer: Self-pay | Admitting: Medical

## 2012-01-23 ENCOUNTER — Other Ambulatory Visit (HOSPITAL_COMMUNITY)
Admission: RE | Admit: 2012-01-23 | Discharge: 2012-01-23 | Disposition: A | Discharge status: Home / Self Care | Payer: PRIVATE HEALTH INSURANCE | Source: Ambulatory Visit | Attending: Medical | Admitting: Medical

## 2012-01-23 ENCOUNTER — Ambulatory Visit
Admission: RE | Admit: 2012-01-23 | Discharge: 2012-01-23 | Disposition: A | Discharge status: Home / Self Care | Payer: PRIVATE HEALTH INSURANCE | Source: Ambulatory Visit | Attending: Family Medicine | Admitting: Family Medicine

## 2012-01-23 ENCOUNTER — Ambulatory Visit (INDEPENDENT_AMBULATORY_CARE_PROVIDER_SITE_OTHER): Payer: PRIVATE HEALTH INSURANCE | Admitting: Medical

## 2012-01-23 ENCOUNTER — Encounter: Payer: Self-pay | Admitting: Medical

## 2012-01-23 VITALS — BP 128/60 | HR 100 | Resp 14 | Ht 65.0 in | Wt 123.0 lb

## 2012-01-23 DIAGNOSIS — Z113 Encounter for screening for infections with a predominantly sexual mode of transmission: Secondary | ICD-10-CM

## 2012-01-23 DIAGNOSIS — Z1231 Encounter for screening mammogram for malignant neoplasm of breast: Secondary | ICD-10-CM

## 2012-01-23 DIAGNOSIS — Z0000 Encounter for general adult medical examination without abnormal findings: Secondary | ICD-10-CM

## 2012-01-23 DIAGNOSIS — E119 Type 2 diabetes mellitus without complications: Secondary | ICD-10-CM

## 2012-01-23 DIAGNOSIS — E049 Nontoxic goiter, unspecified: Secondary | ICD-10-CM

## 2012-01-23 DIAGNOSIS — R000 Tachycardia, unspecified: Secondary | ICD-10-CM

## 2012-01-23 DIAGNOSIS — N760 Acute vaginitis: Secondary | ICD-10-CM | POA: Insufficient documentation

## 2012-01-23 DIAGNOSIS — Z124 Encounter for screening for malignant neoplasm of cervix: Secondary | ICD-10-CM

## 2012-01-23 DIAGNOSIS — Z01419 Encounter for gynecological examination (general) (routine) without abnormal findings: Secondary | ICD-10-CM | POA: Insufficient documentation

## 2012-01-23 LAB — COMPREHENSIVE METABOLIC PANEL
ALT: 44 U/L — ABNORMAL HIGH (ref 0–35)
AST: 25 U/L (ref 0–37)
Alkaline Phosphatase: 166 U/L — ABNORMAL HIGH (ref 39–117)
BUN: 12 mg/dL (ref 6–23)
CO2: 23 mEq/L (ref 19–32)
Calcium: 9.9 mg/dL (ref 8.4–10.5)
Chloride: 103 mEq/L (ref 96–112)
Creat: 0.51 mg/dL (ref 0.50–1.10)
Potassium: 4.1 mEq/L (ref 3.5–5.3)
Sodium: 140 mEq/L (ref 135–145)
Total Bilirubin: 0.6 mg/dL (ref 0.3–1.2)
Total Protein: 7.7 g/dL (ref 6.0–8.3)

## 2012-01-23 LAB — CBC WITH DIFFERENTIAL/PLATELET
Basophils Absolute: 0 10*3/uL (ref 0.0–0.1)
Basophils Relative: 0 % (ref 0–1)
Eosinophils Relative: 0 % (ref 0–5)
HCT: 38.8 % (ref 36.0–46.0)
Hemoglobin: 12.3 g/dL (ref 12.0–15.0)
Lymphocytes Relative: 30 % (ref 12–46)
Lymphs Abs: 2.2 10*3/uL (ref 0.7–4.0)
MCH: 26.1 pg (ref 26.0–34.0)
MCHC: 31.7 g/dL (ref 30.0–36.0)
MCV: 82.2 fL (ref 78.0–100.0)
Monocytes Absolute: 0.5 10*3/uL (ref 0.1–1.0)
Monocytes Relative: 7 % (ref 3–12)
Neutro Abs: 4.8 10*3/uL (ref 1.7–7.7)
Neutrophils Relative %: 63 % (ref 43–77)
Platelets: 226 10*3/uL (ref 150–400)
RBC: 4.72 MIL/uL (ref 3.87–5.11)
WBC: 7.5 10*3/uL (ref 4.0–10.5)

## 2012-01-23 LAB — POCT URINALYSIS DIPSTICK
Bilirubin, UA: NEGATIVE
Blood, UA: NEGATIVE
Glucose, UA: NEGATIVE
Ketones, UA: NEGATIVE
Leukocytes, UA: NEGATIVE
Nitrite, UA: NEGATIVE
Protein, UA: NEGATIVE
Urobilinogen, UA: NEGATIVE
pH, UA: 5

## 2012-01-23 LAB — LIPID PANEL
HDL: 51 mg/dL (ref >39)
LDL Cholesterol: 74 mg/dL (ref 0–99)
Total CHOL/HDL Ratio: 2.7 Ratio
VLDL: 11 mg/dL (ref 0–40)

## 2012-01-23 LAB — POCT GLYCOSYLATED HEMOGLOBIN (HGB A1C): Hemoglobin A1C: 6.7

## 2012-01-23 NOTE — Progress Notes (Addendum)
Subjective:   HPI  Sandra Valentine is a 51 y.o. female who presents for a complete physical.  Last care here in July 2012.    Preventative care: Mammogram- has it today Pap smear - ? unknown Eye doctor visit - July 2012, f/u appt tomorrow Dentist - June 2012 Gets flu shot yearly. Pneumonia vaccine - declines Colonoscopy - 02/2010, repeat 5 years  Reviewed their medical, surgical, family, social, medication, and allergy history and updated chart as appropriate.  Past Medical History  Diagnosis Date  . DDD (degenerative disc disease), lumbar   . Sessile colonic polyp   . Hyperlipidemia   . Diabetes mellitus     diet controlled    Past Surgical History  Procedure Date  . Cholecystectomy   . Tubal ligation   . Colonoscopy 02/2010   Regarding family history, pt notes that father ended up dying of colon cancer just months after a reported normal colonoscopy.   Family History  Problem Relation Age of Onset  . Other Mother     cause of death unkown, but likely renal  . Kidney disease Mother   . Hypertension Mother   . Cancer Father 58    died of colon cancer  . Cancer Paternal Grandfather   . Heart disease Neg Hx   . Diabetes Neg Hx   . Stroke Neg Hx     History   Social History  . Marital Status: Married    Spouse Name: N/A    Number of Children: N/A  . Years of Education: N/A   Occupational History  . CUSTOMER SERVICE [employer]    [place]   Social History Main Topics  . Smoking status: Never Smoker   . Smokeless tobacco: Not on file  . Alcohol Use: No  . Drug Use: No  . Sexually Active: Not on file   Other Topics Concern  . Not on file   Social History Narrative   Married, 4 children, exercise- walking, [employer]    No current outpatient prescriptions on file prior to visit.    No Known Allergies   Review of Systems Constitutional: denies fever, chills, sweats, unexpected weight change, anorexia, fatigue Allergy: negative;  denies recent sneezing, itching, congestion Dermatology: denies changing moles, rash, lumps, new worrisome lesions ENT: no runny nose, ear pain, sore throat, hoarseness, sinus pain, teeth pain, tinnitus, hearing loss, epistaxis Cardiology: denies chest pain, palpitations, edema, orthopnea, paroxysmal nocturnal dyspnea Respiratory: denies cough, shortness of breath, dyspnea on exertion, wheezing, hemoptysis Gastroenterology: denies abdominal pain, nausea, vomiting, diarrhea, constipation, blood in stool, changes in bowel movement, dysphagia Hematology: denies bleeding or bruising problems Musculoskeletal: denies arthralgias, myalgias, joint swelling, back pain, neck pain, cramping, gait changes Ophthalmology: denies vision changes, eye redness, itching, discharge Urology: denies dysuria, difficulty urinating, hematuria, urinary frequency, urgency, incontinence Neurology: no headache, weakness, tingling, numbness, speech abnormality, memory loss, falls, dizziness Psychology: denies depressed mood, agitation, sleep problems     Objective:   Physical Exam  Filed Vitals:   01/23/12 0919  BP: 128/60  Pulse: 100  Resp: 14    General appearance: alert, no distress, WD/WN, thin black female, pleasant Skin:  HEENT: normocephalic, conjunctiva/corneas normal, sclerae anicteric, PERRLA, EOMi, nares patent, no discharge or erythema, pharynx normal Oral cavity: MMM, tongue normal, teeth with some decay of upper molars Neck: supple, no lymphadenopathy, thyroid seems generalized slightly enlarged, no specific nodule, no masses, normal ROM, no bruits Chest: non tender, normal shape and expansion Heart: RRR, normal S1,  S2, no murmurs Lungs: CTA bilaterally, no wheezes, rhonchi, or rales Abdomen: +bs, soft, non tender, non distended, no masses, no hepatomegaly, no splenomegaly, no bruits Back: non tender, normal ROM, no scoliosis Musculoskeletal: upper extremities non tender, no obvious deformity,  normal ROM throughout, lower extremities non tender, no obvious deformity, normal ROM throughout Extremities: no edema, no cyanosis, no clubbing Pulses: 2+ symmetric, upper and lower extremities, normal cap refill Neurological: alert, oriented x 3, CN2-12 intact, strength normal upper extremities and lower extremities, sensation normal throughout, DTRs 2+ throughout, no cerebellar signs, gait normal Psychiatric: normal affect, behavior normal, pleasant  Breast: nontender, scattered small nodular tissue suggestive of fibrocystic changes, otherwise no specific worrisome lumps, no skin changes, no nipple discharge or inversion, no axillary lymphadenopathy Gyn: Normal external genitalia without lesions, vagina with normal mucosa, cervix without lesions, no cervical motion tenderness, slight white vaginal discharge.  Uterus and adnexa not enlarged, nontender, no masses.  Pap performed.  Exam chaperoned by nurse. Rectal: anus with few small external hemorrhoids, occult negative stool   Adult ECG Report  Indication: tachycardia on exam  Rate: 119 bpm  Rhythm: sinus tachycardia  QRS Axis: 67 degrees  PR Interval:  QRS Duration: 74ms  QTc:  Conduction Disturbances: none  Other Abnormalities: none and left atrial enlargement  Patient's cardiac risk factors are: diabetes  EKG comparison: 04/2004 - compared to prior, this EKG shows atrial enlargement and tachycardia  Narrative Interpretation: atrial enlargement and sinus tachycardia   Assessment and Plan :    Encounter Diagnoses  Name Primary?  . Routine general medical examination at a health care facility Yes  . Diabetes mellitus   . Screening for STD (sexually transmitted disease)   . Cervical cancer   . Tachycardia      Physical exam - discussed healthy lifestyle, diet, exercise, preventative care, vaccinations, and addressed their concerns.  Handout given.  Diabetes - she has formerly rejected the diagnosis, but looking back  in chart, her HgbA1C has been over 6.5% in the past. She has been diet controled though.  STD screening today along with pap and HPV screening.  Of note, prior pap in 2011 showed Trichomonas.  She is in a monogamous relationship, and we discussed screening today.  Tachycardia - pending labs.   Follow-up pending labs

## 2012-01-23 NOTE — Patient Instructions (Signed)

## 2012-01-24 LAB — HEPATITIS C ANTIBODY: HCV Ab: NEGATIVE

## 2012-01-24 LAB — RPR

## 2012-01-24 LAB — MICROALBUMIN / CREATININE URINE RATIO
Creatinine, Urine: 38.5 mg/dL
Microalb, Ur: 0.5 mg/dL (ref 0.00–1.89)

## 2012-01-26 ENCOUNTER — Telehealth: Payer: Self-pay | Admitting: Medical

## 2012-01-26 ENCOUNTER — Other Ambulatory Visit: Payer: Self-pay | Admitting: Medical

## 2012-01-26 ENCOUNTER — Encounter: Payer: Self-pay | Admitting: Family Medicine

## 2012-01-26 ENCOUNTER — Encounter: Payer: Self-pay | Admitting: Medical

## 2012-01-26 ENCOUNTER — Encounter: Payer: Self-pay | Admitting: Internal Medicine

## 2012-01-26 DIAGNOSIS — E049 Nontoxic goiter, unspecified: Secondary | ICD-10-CM

## 2012-01-26 DIAGNOSIS — R748 Abnormal levels of other serum enzymes: Secondary | ICD-10-CM

## 2012-01-26 MED ORDER — METOPROLOL SUCCINATE ER 50 MG PO TB24: 50.0000 mg | ORAL_TABLET | Freq: Every day | ORAL | Status: DC

## 2012-01-26 MED ORDER — METRONIDAZOLE 500 MG PO TABS: ORAL_TABLET | ORAL | Status: DC

## 2012-01-26 NOTE — Progress Notes (Signed)
Addended by: Laureen Ochs F on: 01/26/2012 03:21 PM   Modules accepted: Orders

## 2012-01-26 NOTE — Telephone Encounter (Signed)
i called pt and spoke to her about results including new hyperthyroidism and pending std labs.  I will call her back today or the next day or two for additional results and plan.

## 2012-01-27 NOTE — Progress Notes (Signed)
Addended by: Leretha Dykes L on: 01/27/2012 02:00 PM   Modules accepted: Orders

## 2012-01-29 ENCOUNTER — Telehealth: Payer: Self-pay | Admitting: Medical

## 2012-01-30 ENCOUNTER — Ambulatory Visit
Admission: RE | Admit: 2012-01-30 | Discharge: 2012-01-30 | Disposition: A | Discharge status: Home / Self Care | Payer: PRIVATE HEALTH INSURANCE | Source: Ambulatory Visit | Attending: Medical | Admitting: Medical

## 2012-01-30 DIAGNOSIS — E049 Nontoxic goiter, unspecified: Secondary | ICD-10-CM

## 2012-01-30 NOTE — Telephone Encounter (Signed)
Vincenza Hews the lab results are on the chart and the pap is still pending one test. The lab said hopfully it will not be to much longer. CLS

## 2012-01-30 NOTE — Telephone Encounter (Signed)
Sandra Valentine her U/S is today. This afternoon at 4pm. Patient would like to know about her pap and the mistake or the mix up on her last pap. That is all she keeps asking me about and I know nothing. Can you please give her a call. CLS

## 2012-01-30 NOTE — Telephone Encounter (Signed)
When is the thyroid ultrasound?  Let her know that we are still awaiting the pap and STD results. But her thyroid labs show overactive thyroid.  C/t the Toprol medication we sent out for the fast heart rate and palpitations, and we will likely be referring her to endocrinology, but I need the ultrasound results.

## 2012-01-30 NOTE — Telephone Encounter (Signed)
pls get Alvino Chapel to print the t3 and GGT as I can't seem them in the computer. Also, I haven't gotten back her pap results.  They normally come in the computer.  Pls check on this.

## 2012-02-02 ENCOUNTER — Telehealth: Payer: Self-pay | Admitting: Medical

## 2012-02-02 NOTE — Telephone Encounter (Signed)
i called pt and discussed where we are with labs.  We are pending labs for pap and STD, and she goes for thyroid ultrasound today.

## 2012-02-02 NOTE — Telephone Encounter (Signed)
Please call concerning her thyroid ultrasound.  Patient very concerned about this and other test results she is waiting for

## 2012-02-03 ENCOUNTER — Other Ambulatory Visit: Payer: Self-pay | Admitting: Medical

## 2012-02-03 DIAGNOSIS — E041 Nontoxic single thyroid nodule: Secondary | ICD-10-CM

## 2012-02-03 DIAGNOSIS — E049 Nontoxic goiter, unspecified: Secondary | ICD-10-CM

## 2012-02-03 DIAGNOSIS — E0590 Thyrotoxicosis, unspecified without thyrotoxic crisis or storm: Secondary | ICD-10-CM

## 2012-02-03 NOTE — Progress Notes (Signed)
I SCHEDULE HER 24 HOUR THYROID UPTAKE SCAN FOR 02/12/12 @ 1245 PM AND 02/13/12 @ 1245 PM AT [facility]. CLS

## 2012-02-09 ENCOUNTER — Telehealth: Payer: Self-pay | Admitting: Internal Medicine

## 2012-02-09 NOTE — Telephone Encounter (Signed)
Pt states that she is taking toprol xl 50mg  24 hr tablet and states that ever since she started taking it that she has had really bad headaches and it only hurts on the right side of her head pt states and she think its to much medicine that she is taking.Marland Kitchen Her husband is almost 400 pounds and he is only taking 25mg . She said kim flemming gave her lisinopril 5mg  before. She wants to know what to do

## 2012-02-09 NOTE — Telephone Encounter (Signed)
Have her set up an appointment to discuss medication change

## 2012-02-09 NOTE — Telephone Encounter (Signed)
Left message word for word  

## 2012-02-10 ENCOUNTER — Telehealth: Payer: Self-pay | Admitting: Medical

## 2012-02-10 NOTE — Telephone Encounter (Signed)
Sandra Valentine - pls let pt know about appt time with endocrinology.  I referred her last week.  She has the thyroid scan scheduled.  She needs f/u appt here in 3-4 wk regarding Toprol.  FYI - i spoke to pt about her concerns.  Toprol giving bad headaches.  She also not sure why she is having another test before seeing specialist.    I told her to cut Toprol in half and take 25mg  XL daily for now, recheck 2-3 wk.    I explained the process related to the test and referral.

## 2012-02-10 NOTE — Telephone Encounter (Signed)
I SPOKE WITH THE PATIENT AS WELL AS THE ENDOCRINOLOGY OFFICE. I EXPLAIN TO HER THAT THE OFFICE HAD TRIED TO CALL HER TO SET AN APPOINTMENT AND THEY SAID THEY WOULD TRY HER AGAIN TODAY TO SEE IF THEY CAN SCHEDULE THE APPOINTMENT WITH HER.CLS

## 2012-02-12 ENCOUNTER — Telehealth: Payer: Self-pay | Admitting: Medical

## 2012-02-12 ENCOUNTER — Encounter (HOSPITAL_COMMUNITY)
Admission: RE | Admit: 2012-02-12 | Discharge: 2012-02-12 | Disposition: A | Discharge status: Home / Self Care | Payer: PRIVATE HEALTH INSURANCE | Source: Ambulatory Visit | Attending: Medical | Admitting: Medical

## 2012-02-12 DIAGNOSIS — E041 Nontoxic single thyroid nodule: Secondary | ICD-10-CM

## 2012-02-12 DIAGNOSIS — E049 Nontoxic goiter, unspecified: Secondary | ICD-10-CM | POA: Insufficient documentation

## 2012-02-12 DIAGNOSIS — E0590 Thyrotoxicosis, unspecified without thyrotoxic crisis or storm: Secondary | ICD-10-CM | POA: Insufficient documentation

## 2012-02-12 DIAGNOSIS — R946 Abnormal results of thyroid function studies: Secondary | ICD-10-CM | POA: Insufficient documentation

## 2012-02-12 MED ORDER — SODIUM IODIDE I 131 CAPSULE
7.5000 | Freq: Once | INTRAVENOUS | Status: AC | PRN
  Administered 2012-02-12: 7.5 via ORAL

## 2012-02-12 NOTE — Telephone Encounter (Signed)
Please call patient. Pt concerned about thyroid labs, she states she was told she has overactive thyroid. Wants more information, very concerned  She will be home tomorrow other that 12:45 radiation appointment

## 2012-02-13 ENCOUNTER — Encounter (HOSPITAL_COMMUNITY)
Admission: RE | Admit: 2012-02-13 | Discharge: 2012-02-13 | Disposition: A | Discharge status: Home / Self Care | Payer: PRIVATE HEALTH INSURANCE | Source: Ambulatory Visit | Attending: Medical | Admitting: Medical

## 2012-02-13 MED ORDER — SODIUM PERTECHNETATE TC 99M INJECTION
10.0000 | Freq: Once | INTRAVENOUS | Status: AC | PRN
  Administered 2012-02-13: 10 via INTRAVENOUS

## 2012-02-13 NOTE — Telephone Encounter (Signed)
At this point, call endocrine to find out about appt time, and if she has a lot of questions, have her come in .

## 2012-02-13 NOTE — Telephone Encounter (Signed)
SHANE HER FIRST THYROID UPTAKE WAS YESTERDAY AT 1245 PM AND SHE FINISHES TODAY AT 1245 PM. I AM NOT SURE WHEN HER APPOINTMENT IS TO SEE THE ENDOCRINOLOGISTS IS BECAUSE THEY CALLED HER AND THEY SET UP THE APPOINTMENT WITH HER. CLS

## 2012-02-13 NOTE — Telephone Encounter (Signed)
Is her thyroid scan today?  When is her endocrinology appt?  i think she needs to come in for OV to discuss her questions.   fyi -  I've already spent over 15 minutes on the phone with her answering questions up to this point

## 2012-02-16 ENCOUNTER — Encounter: Payer: Self-pay | Admitting: Medical

## 2012-02-16 ENCOUNTER — Ambulatory Visit (INDEPENDENT_AMBULATORY_CARE_PROVIDER_SITE_OTHER): Payer: PRIVATE HEALTH INSURANCE | Admitting: Medical

## 2012-02-16 VITALS — BP 120/80 | HR 80 | Temp 98.3°F | Resp 16 | Wt 123.0 lb

## 2012-02-16 DIAGNOSIS — E0590 Thyrotoxicosis, unspecified without thyrotoxic crisis or storm: Secondary | ICD-10-CM

## 2012-02-16 DIAGNOSIS — R748 Abnormal levels of other serum enzymes: Secondary | ICD-10-CM

## 2012-02-16 DIAGNOSIS — R000 Tachycardia, unspecified: Secondary | ICD-10-CM

## 2012-02-16 NOTE — Telephone Encounter (Signed)
PATIENT CALLED THIS MORNING ASKING MORE QUESTIONS THAT I COULD NOT ANSWER. I ADVISED HER TO SCHEDULE AN APPOINTMENT WHERE SHE COULD ASK ALL QUESTIONS AND RECEIVE ALL HER ANSWERS. PATIENT WAS ASKING ABOUT HER THYROID UPTAKE SCAN. SHE SCHEDULED AN APPOINTMENT TODAY TO BE SEEN. I WILL SCHEDULE HER AN APPOINTMENT TO SEE ANOTHER ENDOCRINOLOGISTS TODAY SO WHEN SHE COMES TODAY I WILL HAVE THE APPOINTMENT. CLS

## 2012-02-16 NOTE — Patient Instructions (Signed)
Hyperthyroidism The thyroid is a large gland located in the lower front part of your neck. The thyroid helps control metabolism. Metabolism is how your body uses food. It controls metabolism with the hormone thyroxine. When the thyroid is overactive, it produces too much hormone. When this happens, these following problems may occur:   Nervousness   Heat intolerance   Weight loss (in spite of increase food intake)   Diarrhea   Change in hair or skin texture   Palpitations (heart skipping or having extra beats)   Tachycardia (rapid heart rate)   Loss of menstruation (amenorrhea)   Shaking of the hands  CAUSES  Grave's Disease (the immune system attacks the thyroid gland). This is the most common cause.   Inflammation of the thyroid gland.   Tumor (usually benign) in the thyroid gland or elsewhere.   Excessive use of thyroid medications (both prescription and 'natural').   Excessive ingestion of Iodine.  DIAGNOSIS  To prove hyperthyroidism, your caregiver may do blood tests and ultrasound tests. Sometimes the signs are hidden. It may be necessary for your caregiver to watch this illness with blood tests, either before or after diagnosis and treatment. TREATMENT Short-term treatment There are several treatments to control symptoms. Drugs called beta blockers may give some relief. Drugs that decrease hormone production will provide temporary relief in many people. These measures will usually not give permanent relief. Definitive therapy There are treatments available which can be discussed between you and your caregiver which will permanently treat the problem. These treatments range from surgery (removal of the thyroid), to the use of radioactive iodine (destroys the thyroid by radiation), to the use of antithyroid drugs (interfere with hormone synthesis). The first two treatments are permanent and usually successful. They most often require hormone replacement therapy for life.  This is because it is impossible to remove or destroy the exact amount of thyroid required to make a person euthyroid (normal). HOME CARE INSTRUCTIONS  See your caregiver if the problems you are being treated for get worse. Examples of this would be the problems listed above. SEEK MEDICAL CARE IF: Your general condition worsens. MAKE SURE YOU:   Understand these instructions.   Will watch your condition.   Will get help right away if you are not doing well or get worse.  Document Released: 10/06/2005 Document Revised: 09/25/2011 Document Reviewed: 02/17/2007 [facility] Patient Information 2012 [place], [place]    For now, continue Toprol 50mg  XL, 1/2 tablet daily until you see Dr. Talmage Nap.

## 2012-02-16 NOTE — Progress Notes (Signed)
Subjective:   HPI  Sandra Valentine is a 51 y.o. female who presents for recheck.  I saw her recently for physical and she was found to have goiter and abnormal labs (low TSH, elevated T4 and T3, elevated LFTs and ALP).  Since then we have sent her for thyroid ultrasound and uptake scan.  She was started on Toprol 50mg  XL daily, but this was giving her really bad headaches.  So I had her cut back to 1/2 tablet daily.  She hasn't been taking this though.  She is here today in f/u to ask qeustions.  No other new c/o.    Past Medical History  Diagnosis Date  . DDD (degenerative disc disease), lumbar   . Sessile colonic polyp   . Hyperlipidemia   . Diabetes mellitus     diet controlled   . Hyperthyroidism     01/2012     Past Surgical History  Procedure Date  . Cholecystectomy   . Colonoscopy 02/2010  . Tubal ligation    Regarding family history, pt notes that father ended up dying of colon cancer just months after a reported normal colonoscopy.   Family History  Problem Relation Age of Onset  . Other Mother     cause of death unkown, but likely renal  . Kidney disease Mother   . Hypertension Mother   . Cancer Father 69    died of colon cancer  . Cancer Paternal Grandfather   . Heart disease Neg Hx   . Diabetes Neg Hx   . Stroke Neg Hx     History   Social History  . Marital Status: Married    Spouse Name: N/A    Number of Children: N/A  . Years of Education: N/A   Occupational History  . CUSTOMER SERVICE [employer]    [place]   Social History Main Topics  . Smoking status: Never Smoker   . Smokeless tobacco: Not on file  . Alcohol Use: No  . Drug Use: No  . Sexually Active: Not on file   Other Topics Concern  . Not on file   Social History Narrative   Married, 4 children, exercise- walking, [employer]    No current outpatient prescriptions on file prior to visit.    No Known Allergies   Review of Systems Constitutional: denies  fever, chills, sweats, unexpected weight change, anorexia, fatigue Cardiology: denies chest pain, palpitations, edema, orthopnea, paroxysmal nocturnal dyspnea Respiratory: denies cough, shortness of breath, dyspnea on exertion, wheezing, hemoptysis Gastroenterology: denies abdominal pain, nausea, vomiting, diarrhea, constipation, blood in stool, changes in bowel movement, dysphagia Musculoskeletal: denies arthralgias, myalgias, joint swelling, back pain, neck pain, cramping, gait changes Ophthalmology: denies vision changes, eye redness, itching, discharge Urology: denies dysuria, difficulty urinating, hematuria, urinary frequency, urgency, incontinence Neurology: no headache, weakness, tingling, numbness, speech abnormality, memory loss, falls, dizziness Psychology: denies depressed mood, agitation, sleep problems     Objective:   Physical Exam  Filed Vitals:   02/16/12 1135  BP: 120/80  Pulse: 80  Temp: 98.3 F (36.8 C)  Resp: 16    General appearance: alert, no distress, WD/WN, thin black female, pleasant Neck: supple, no lymphadenopathy, thyroid seems generalized mildly enlarged, no specific nodule, no masses, normal ROM, no bruits Heart: RRR, normal S1, S2, no murmurs Lungs: CTA bilaterally, no wheezes, rhonchi, or rales Pulses: 2+ symmetric, upper and lower extremities, normal cap refill   Assessment and Plan :    Encounter Diagnoses  Name  Primary?  . Hyperthyroidism Yes  . Tachycardia   . Elevated liver enzymes    I explained her new diagnosis since last visit of hyperthyroidism.  We had a long discussion today about difference between hyper and hypothyroidism, hyperthyroidism causes, testing, treatment options.  We discussed her labs, ultrasound and scan results.  I asked her to c/t Toprol XL 50mg , but at 1/2 tablet daily for tachycardia.  She is otherwise asymptomatic.    She has been referred to endocrinology for further eval and management.  Of note, recent studies  as follows:  Thyroid uptake scan on 02/03/12: IMPRESSION:  1. Elevated (60%) 24 hour radioiodine uptake by the thyroid gland.  2. 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  Thyroid ultrasound on 01/30/12: IMPRESSION:  Enlarged inhomogeneous thyroid with only a single measurable nodule  of 1.3 cm in maximum diameter  Noteworthy labs of recent in April: TSH <0.008 low, FreeT4 = 4.94 elevated, T3 total 383.3 high, ALP 166 elevated, ALT 44 elevated, AST 25 normal

## 2012-02-18 ENCOUNTER — Other Ambulatory Visit (HOSPITAL_COMMUNITY): Payer: Self-pay | Admitting: Endocrinology

## 2012-02-18 DIAGNOSIS — E0590 Thyrotoxicosis, unspecified without thyrotoxic crisis or storm: Secondary | ICD-10-CM

## 2012-03-01 ENCOUNTER — Encounter (HOSPITAL_COMMUNITY)
Admission: RE | Admit: 2012-03-01 | Discharge: 2012-03-01 | Disposition: A | Discharge status: Home / Self Care | Payer: PRIVATE HEALTH INSURANCE | Source: Ambulatory Visit | Attending: Medical | Admitting: Medical

## 2012-03-01 DIAGNOSIS — E0590 Thyrotoxicosis, unspecified without thyrotoxic crisis or storm: Secondary | ICD-10-CM | POA: Insufficient documentation

## 2012-03-01 MED ORDER — SODIUM IODIDE I 131 CAPSULE
16.3000 | Freq: Once | INTRAVENOUS | Status: AC | PRN
  Administered 2012-03-01: 16.3 via ORAL

## 2012-03-30 ENCOUNTER — Other Ambulatory Visit: Payer: Self-pay | Admitting: Medical

## 2012-03-30 MED ORDER — METOPROLOL SUCCINATE ER 25 MG PO TB24: 25.0000 mg | ORAL_TABLET | Freq: Every day | ORAL | Status: DC

## 2012-06-10 ENCOUNTER — Other Ambulatory Visit: Payer: Self-pay | Admitting: Medical

## 2012-09-14 ENCOUNTER — Encounter: Payer: Self-pay | Admitting: Medical

## 2012-09-14 ENCOUNTER — Ambulatory Visit (INDEPENDENT_AMBULATORY_CARE_PROVIDER_SITE_OTHER): Payer: BC Managed Care – PPO | Admitting: Medical

## 2012-09-14 VITALS — BP 102/80 | HR 88 | Temp 98.0°F | Resp 16 | Wt 152.0 lb

## 2012-09-14 DIAGNOSIS — M5380 Other specified dorsopathies, site unspecified: Secondary | ICD-10-CM

## 2012-09-14 DIAGNOSIS — S39012A Strain of muscle, fascia and tendon of lower back, initial encounter: Secondary | ICD-10-CM

## 2012-09-14 DIAGNOSIS — E039 Hypothyroidism, unspecified: Secondary | ICD-10-CM

## 2012-09-14 DIAGNOSIS — IMO0002 Reserved for concepts with insufficient information to code with codable children: Secondary | ICD-10-CM

## 2012-09-14 DIAGNOSIS — M62830 Muscle spasm of back: Secondary | ICD-10-CM

## 2012-09-14 MED ORDER — METAXALONE 800 MG PO TABS: ORAL_TABLET | ORAL | Status: DC

## 2012-09-14 MED ORDER — DICLOFENAC SODIUM 75 MG PO TBEC: 75.0000 mg | DELAYED_RELEASE_TABLET | Freq: Two times a day (BID) | ORAL | Status: DC

## 2012-09-14 NOTE — Progress Notes (Signed)
Subjective: Here for c/o pain.  Started yesterday morning about 9am, pain behind left knee.   Wondered if she had just been sitting too long.  Pain didn't ease up during the day, but later in the day pain traveled to right leg and across low back.   Feels kind of down/malaise. Had to sit down to put on shoe this morning.  She does report hx/o DDD in L spine.  Having low back pain, stiffness in low back, hurts to bend over or extend back.  Denies injury, heavy lifting, strenuous activity. No other aggravating or relieving factors.    She has f/u with Dr. Talmage Nap soon.  I sent her earlier in the year for overactive thyroid, she ended up with ablation, and is now on medication for hypothyroid, doing well with this.  Past Medical History  Diagnosis Date  . DDD (degenerative disc disease), lumbar   . Sessile colonic polyp   . Hyperlipidemia   . Diabetes mellitus     diet controlled   . Hyperthyroidism     01/2012    Review of Systems Constitutional: -fever, -chills, -sweats, -unexpected -weight change,-fatigue Skin: no rash Cardiology:  -chest pain, -palpitations, -edema Respiratory: -cough, -shortness of breath, -wheezing Gastroenterology: -abdominal pain, -nausea, -vomiting, -diarrhea, -constipation  Urology: -dysuria, -difficulty urinating, -hematuria, -urinary frequency, -urgency Neurology: -headache, -weakness, -tingling, -numbness    Objective:   Physical Exam  Filed Vitals:   09/14/12 1523  BP: 102/80  Pulse: 88  Temp: 98 F (36.7 C)  Resp: 16    General appearance: alert, no distress, WD/WN,  Abdomen: +bs, soft, non tender, non distended, no masses, no hepatomegaly, no splenomegaly Back: tender right paraspinal muscles, limited flexion/extension due to pain, otherwise nontender MSK: LE nontender, normal ROM Pulses: 2+ symmetric Neuro: unable to heel walk today, but can toe walk, -SLR, normal LE strength, DTRs, sensation  Assessment and Plan :    Encounter Diagnoses    Name Primary?  . Muscle spasm of back Yes  . Back strain    Reviewed 2009 L spine xray showing mild DDD L spine.  Advised relative rest, gentle stretching and ROM activity, consider massage, begin diclofenac BID x the next week, then prn, skelaxin QHS prn, and if not improving in 1-2 wk, then recheck.  Hypothyroidism - f/u with Dr. Talmage Nap  Due back here soon for recheck on labs from 01/2012 regarding impaired glucose  Follow-up if not improving

## 2012-10-19 ENCOUNTER — Encounter: Payer: Self-pay | Admitting: Medical

## 2012-10-19 ENCOUNTER — Ambulatory Visit (INDEPENDENT_AMBULATORY_CARE_PROVIDER_SITE_OTHER): Payer: BC Managed Care – PPO | Admitting: Medical

## 2012-10-19 VITALS — BP 92/60 | HR 90 | Temp 98.4°F | Resp 16 | Wt 149.0 lb

## 2012-10-19 DIAGNOSIS — K5289 Other specified noninfective gastroenteritis and colitis: Secondary | ICD-10-CM

## 2012-10-19 DIAGNOSIS — K529 Noninfective gastroenteritis and colitis, unspecified: Secondary | ICD-10-CM

## 2012-10-19 DIAGNOSIS — R110 Nausea: Secondary | ICD-10-CM

## 2012-10-19 MED ORDER — PROMETHAZINE HCL 25 MG PO TABS: 25.0000 mg | ORAL_TABLET | Freq: Four times a day (QID) | ORAL | Status: DC | PRN

## 2012-10-19 NOTE — Progress Notes (Signed)
Subjective: Here for acute onset of illness.  Started abruptly yesterday at work started getting nauseated.  Few minutes later had diarrhea.  Since yesterday, at least 10 diarrhea stools.  All she felt like doing was getting in the bed and resting.   Husband has the same.  57mo old grandson apparently had this recently and they had been watching him.   They didn't find out about this until yesterday.  Had chills all day yesterday.  Using chicken broth, gingerale.  No appetite though. Denies fever, no blood or mucous in the stool.   No recently long travel, no camping or consumption of undercooked food or raw meat recently.    Past Medical History  Diagnosis Date  . DDD (degenerative disc disease), lumbar   . Sessile colonic polyp   . Hyperlipidemia   . Diabetes mellitus     diet controlled   . Hyperthyroidism     01/2012    Review of Systems Constitutional: -fever,-sweats, -unexpected weight change ENT: -runny nose, -ear pain, -sore throat Cardiology:  -chest pain, -palpitations, -edema Respiratory: -cough, -shortness of breath, -wheezing Urology: -dysuria, -difficulty urinating, -hematuria, -urinary frequency, -urgency Neurology: -headache, -weakness, -tingling, -numbness     Objective:   Physical Exam  Filed Vitals:   10/19/12 0939  BP: 92/60  Pulse: 90  Temp: 98.4 F (36.9 C)  Resp: 16    General appearance: alert, no distress, WD/WN Oral cavity: MMM, no lesions Neck: supple, no lymphadenopathy, no thyromegaly, no masses Heart: RRR, normal S1, S2, no murmurs Lungs: CTA bilaterally, no wheezes, rhonchi, or rales Abdomen: +increased bs, soft, mild generalized tenderness, non distended, no masses, no hepatomegaly, no splenomegaly Pulses: 2+ symmetric   Assessment and Plan :    Encounter Diagnoses  Name Primary?  . Acute gastroenteritis Yes  . Nausea    discussed viral gastroenteritis diagnosis, treatment, and usual time frame of illness.  Advised hydration, rest, avoid  spread to others, discussed hygiene, and script for promethazine for nausea.  If not much improved by Friday let me know . Handout given.  discussed symptoms/signs that should prompt recheck.

## 2012-10-19 NOTE — Patient Instructions (Signed)
Viral Gastroenteritis Viral gastroenteritis is also known as stomach flu. This condition affects the stomach and intestinal tract. It can cause sudden diarrhea and vomiting. The illness typically lasts 3 to 8 days. Most people develop an immune response that eventually gets rid of the virus. While this natural response develops, the virus can make you quite ill. CAUSES  Many different viruses can cause gastroenteritis, such as rotavirus or noroviruses. You can catch one of these viruses by consuming contaminated food or water. You may also catch a virus by sharing utensils or other personal items with an infected person or by touching a contaminated surface. SYMPTOMS  The most common symptoms are diarrhea and vomiting. These problems can cause a severe loss of body fluids (dehydration) and a body salt (electrolyte) imbalance. Other symptoms may include:  Fever.  Headache.  Fatigue.  Abdominal pain. DIAGNOSIS  Your caregiver can usually diagnose viral gastroenteritis based on your symptoms and a physical exam. A stool sample may also be taken to test for the presence of viruses or other infections. TREATMENT  This illness typically goes away on its own. Treatments are aimed at rehydration. The most serious cases of viral gastroenteritis involve vomiting so severely that you are not able to keep fluids down. In these cases, fluids must be given through an intravenous line (IV). HOME CARE INSTRUCTIONS   Drink enough fluids to keep your urine clear or pale yellow. Drink small amounts of fluids frequently and increase the amounts as tolerated.  Ask your caregiver for specific rehydration instructions.  Avoid:  Foods high in sugar.  Alcohol.  Carbonated drinks.  Tobacco.  Juice.  Caffeine drinks.  Extremely hot or cold fluids.  Fatty, greasy foods.  Too much intake of anything at one time.  Dairy products until 24 to 48 hours after diarrhea stops.  You may consume probiotics.  Probiotics are active cultures of beneficial bacteria. They may lessen the amount and number of diarrheal stools in adults. Probiotics can be found in yogurt with active cultures and in supplements.  Wash your hands well to avoid spreading the virus.  Only take over-the-counter or prescription medicines for pain, discomfort, or fever as directed by your caregiver. Do not give aspirin to children. Antidiarrheal medicines are not recommended.  Ask your caregiver if you should continue to take your regular prescribed and over-the-counter medicines.  Keep all follow-up appointments as directed by your caregiver. SEEK IMMEDIATE MEDICAL CARE IF:   You are unable to keep fluids down.  You do not urinate at least once every 6 to 8 hours.  You develop shortness of breath.  You notice blood in your stool or vomit. This may look like coffee grounds.  You have abdominal pain that increases or is concentrated in one small area (localized).  You have persistent vomiting or diarrhea.  You have a fever.  The patient is a child younger than 3 months, and he or she has a fever.  The patient is a child older than 3 months, and he or she has a fever and persistent symptoms.  The patient is a child older than 3 months, and he or she has a fever and symptoms suddenly get worse.  The patient is a baby, and he or she has no tears when crying. MAKE SURE YOU:   Understand these instructions.  Will watch your condition.  Will get help right away if you are not doing well or get worse. Document Released: 10/06/2005 Document Revised: 12/29/2011 Document Reviewed: 07/23/2011   ExitCare Patient Information 2013 [employer].  

## 2012-12-06 ENCOUNTER — Encounter: Payer: Self-pay | Admitting: Medical

## 2012-12-06 ENCOUNTER — Ambulatory Visit (INDEPENDENT_AMBULATORY_CARE_PROVIDER_SITE_OTHER): Payer: BC Managed Care – PPO | Admitting: Medical

## 2012-12-06 VITALS — BP 130/60 | HR 92 | Temp 100.6°F | Resp 16 | Wt 156.0 lb

## 2012-12-06 DIAGNOSIS — M5136 Other intervertebral disc degeneration, lumbar region: Secondary | ICD-10-CM

## 2012-12-06 DIAGNOSIS — M25552 Pain in left hip: Secondary | ICD-10-CM

## 2012-12-06 DIAGNOSIS — M545 Low back pain, unspecified: Secondary | ICD-10-CM

## 2012-12-06 DIAGNOSIS — M25559 Pain in unspecified hip: Secondary | ICD-10-CM

## 2012-12-06 DIAGNOSIS — M533 Sacrococcygeal disorders, not elsewhere classified: Secondary | ICD-10-CM

## 2012-12-06 DIAGNOSIS — M5137 Other intervertebral disc degeneration, lumbosacral region: Secondary | ICD-10-CM

## 2012-12-06 DIAGNOSIS — M51379 Other intervertebral disc degeneration, lumbosacral region without mention of lumbar back pain or lower extremity pain: Secondary | ICD-10-CM

## 2012-12-06 MED ORDER — TRAMADOL HCL 50 MG PO TABS: 50.0000 mg | ORAL_TABLET | Freq: Four times a day (QID) | ORAL | Status: DC | PRN

## 2012-12-06 NOTE — Patient Instructions (Signed)
Start back on Diclofenac twice daily for pain and inflammation for the next 1-2 weeks.  You can use Ultram as needed, 1 tablet every 6 hours for worse pain if needed.  You can use the Skelaxin muscle relaxer as needed.  Just be aware that this can make you sleepy.  No lifting over 10 lbs for the next 5 days.  I do want you moving and stretching gently throughout the day.   Take frequency breaks from prolonged sitting, but no strenuous activity or heavy lifting.     I am going to refer you to physical therapy.

## 2012-12-06 NOTE — Progress Notes (Signed)
Subjective: Here for c/o pain.  I saw her in November for similar which resolved.  She is here for similar c/o though.  She has been helping her husband around the house with ambulation.  He has rheumatoid arthritis, and she does assist him at times.    He is morbidly obese.  Other than this, she denies any other recent injury, trauma or fall. She notes over the last week been having low back pain again, radiates to the left side and left leg.  Pain in lateral and anterior thigh.  No numbness, weakness, tingling.  She does have a lot of stairs in her home that she has to use often.   Pain is worsened by lifting, standing bending, sitting for prolonged periods.  Can't put as much weight on her left leg due to pain.  Using OTC pain patch.  She still has diclofenac and muscle relaxer but hasn't been using these the last few days.  No other aggravating or relieving factors.     Past Medical History  Diagnosis Date  . DDD (degenerative disc disease), lumbar   . Sessile colonic polyp   . Hyperlipidemia   . Diabetes mellitus     diet controlled   . Hyperthyroidism     01/2012    Review of Systems Constitutional: -fever, -chills, -sweats, -unexpected -weight change,-fatigue Skin: no rash Cardiology:  -chest pain, -palpitations, -edema Respiratory: -cough, -shortness of breath, -wheezing Gastroenterology: -abdominal pain, -nausea, -vomiting, -diarrhea, -constipation  Urology: -dysuria, -difficulty urinating, -hematuria, -urinary frequency, -urgency Neurology: -headache, -weakness, -tingling, -numbness    Objective:   Physical Exam  Filed Vitals:   12/06/12 1053  BP: 130/60  Pulse: 92  Temp: 100.6 F (38.1 C)  Resp: 16    General appearance: alert, no distress, WD/WN Abdomen: +bs, soft, non tender, non distended, no masses, no hepatomegaly, no splenomegaly Back: tender right paraspinal muscles, limited flexion/extension due to pain, otherwise nontender MSK: mild tenderness over left hip,  mild pain with left hip internal and external ROM which is mildly reduced.  Otherwise LE nontender, normal ROM Pulses: 2+ symmetric Neuro: unable to heel walk today, but can toe walk on the left, +SLR left leg to 30 degrees, normal LE strength, DTRs, sensation  Assessment and Plan :    Encounter Diagnoses  Name Primary?  . Back pain, lumbosacral Yes  . Hip pain, left   . DDD (degenerative disc disease), lumbar    Reviewed 2009 L spine xray showing mild DDD L spine.  Advised relative rest, gentle stretching and ROM activity, restart diclofenac BID, skelaxin QHS prn, ultram for breakthrough pain if needed.   Discussed avoidance of injury.   Referral to PT.

## 2012-12-08 ENCOUNTER — Telehealth: Payer: Self-pay | Admitting: Family Medicine

## 2012-12-08 NOTE — Telephone Encounter (Signed)
Patient is aware of her appointment at [facility] on 12/07/12 @ 430 pm. CLS

## 2013-05-05 ENCOUNTER — Other Ambulatory Visit: Payer: Self-pay

## 2013-05-05 DIAGNOSIS — Z1231 Encounter for screening mammogram for malignant neoplasm of breast: Secondary | ICD-10-CM

## 2013-05-26 ENCOUNTER — Ambulatory Visit
Admission: RE | Admit: 2013-05-26 | Discharge: 2013-05-26 | Disposition: A | Discharge status: Home / Self Care | Payer: BC Managed Care – PPO | Source: Ambulatory Visit

## 2013-05-26 DIAGNOSIS — Z1231 Encounter for screening mammogram for malignant neoplasm of breast: Secondary | ICD-10-CM

## 2013-05-27 ENCOUNTER — Other Ambulatory Visit: Payer: Self-pay | Admitting: Family Medicine

## 2013-05-27 DIAGNOSIS — R928 Other abnormal and inconclusive findings on diagnostic imaging of breast: Secondary | ICD-10-CM

## 2013-06-13 ENCOUNTER — Ambulatory Visit
Admission: RE | Admit: 2013-06-13 | Discharge: 2013-06-13 | Disposition: A | Discharge status: Home / Self Care | Payer: BC Managed Care – PPO | Source: Ambulatory Visit | Attending: Family Medicine | Admitting: Family Medicine

## 2013-06-13 DIAGNOSIS — R928 Other abnormal and inconclusive findings on diagnostic imaging of breast: Secondary | ICD-10-CM

## 2013-07-04 ENCOUNTER — Telehealth: Payer: Self-pay | Admitting: Medical

## 2013-07-04 MED ORDER — TRAMADOL HCL 50 MG PO TABS: 50.0000 mg | ORAL_TABLET | Freq: Four times a day (QID) | ORAL | Status: DC | PRN

## 2013-07-04 NOTE — Telephone Encounter (Signed)
Pt sees Shane. Pt is requesting a refill on her ultram. Pt states she is down with her back again. Pt was offered an appt and declined. States she cant get out of bed. [facility] [address]

## 2014-05-16 ENCOUNTER — Telehealth: Payer: Self-pay | Admitting: Family Medicine

## 2014-05-16 NOTE — Telephone Encounter (Signed)
Please take care of this.  

## 2014-05-16 NOTE — Telephone Encounter (Signed)
Pt applying for life insurance and some how her prescription records have gotten crossed with her husband's Sandra Valentine -DOB [birth year] who is a pt of Dr [employer] [employer]) and shows that [facility] was prescribed Nitroglycerin by Dr [employer] [employer] on 04/11/14. Pt requesting a letter be emailed to her @ [email or web address] stating that she does not have and never had a heart condition and has never been on Nitroglycerin.

## 2014-05-18 ENCOUNTER — Encounter: Payer: Self-pay | Admitting: Family Medicine

## 2014-06-02 ENCOUNTER — Encounter: Payer: Self-pay | Admitting: Medical

## 2014-11-28 ENCOUNTER — Other Ambulatory Visit: Payer: Self-pay

## 2014-11-28 DIAGNOSIS — Z1231 Encounter for screening mammogram for malignant neoplasm of breast: Secondary | ICD-10-CM

## 2014-12-08 ENCOUNTER — Ambulatory Visit
Admission: RE | Admit: 2014-12-08 | Discharge: 2014-12-08 | Disposition: A | Discharge status: Home / Self Care | Payer: BLUE CROSS/BLUE SHIELD | Source: Ambulatory Visit

## 2014-12-08 DIAGNOSIS — Z1231 Encounter for screening mammogram for malignant neoplasm of breast: Secondary | ICD-10-CM

## 2015-01-08 ENCOUNTER — Encounter: Payer: Self-pay | Admitting: Medical

## 2015-01-08 ENCOUNTER — Ambulatory Visit (INDEPENDENT_AMBULATORY_CARE_PROVIDER_SITE_OTHER): Payer: BLUE CROSS/BLUE SHIELD | Admitting: Medical

## 2015-01-08 VITALS — BP 112/80 | HR 104 | Temp 98.7°F | Resp 15 | Wt 148.0 lb

## 2015-01-08 DIAGNOSIS — J101 Influenza due to other identified influenza virus with other respiratory manifestations: Secondary | ICD-10-CM | POA: Diagnosis not present

## 2015-01-08 LAB — POC INFLUENZA A&B (BINAX/QUICKVUE)
INFLUENZA A, POC: POSITIVE
INFLUENZA B, POC: NEGATIVE

## 2015-01-08 MED ORDER — OSELTAMIVIR PHOSPHATE 75 MG PO CAPS: 75.0000 mg | ORAL_CAPSULE | Freq: Two times a day (BID) | ORAL | Status: DC

## 2015-01-08 NOTE — Patient Instructions (Signed)
Influenza, Adult Influenza ("the flu") is a viral infection of the respiratory tract. It causes chills, fever, cough, headache, body aches, and sore throat. Influenza in general will make you feel sicker than when you have a common cold. Symptoms of the illness typically last a few days. Cough and fatigue may continue for as long as 7 to 10 days. Influenza is highly contagious. It spreads easily to others in the droplets from coughs and sneezes. People frequently become infected by touching something that was recently contaminated with the virus and then touch their mouth, nose or eyes. This infection is caused by a virus. Symptoms will not be reduced or improved by taking an antibiotic. Antibiotics are medications that kill bacteria, not viruses. DIAGNOSIS  Diagnosis of influenza is often made based on the history and physical examination as well as the presence of influenza reports occurring in your community. Testing can be done if the diagnosis is not certain. TREATMENT  Since influenza is caused by a virus, antibiotics are not helpful. Your caregiver may prescribe antiviral medicines to shorten the illness and lessen the severity. Your caregiver may also recommend influenza vaccination and/or antiviral medicines for your family members in order to prevent the spread of influenza to them. HOME CARE INSTRUCTIONS  DO NOT GIVE ASPIRIN TO PERSONS WITH INFLUENZA WHO ARE UNDER 18 YEARS OF AGE. This could lead to brain and liver damage (Reye's syndrome). Read the label on over-the-counter medicines.   Stay home from work or school if at all possible until most of your symptoms are gone.   Only take over-the-counter or prescription medicines for pain, discomfort, or fever as directed by your caregiver.   Use a cool mist humidifier to increase air moisture. This will make breathing easier.   Rest until your temperature is nearly normal: 98.6 F (37 C). This usually takes 3 to 4 days. Be sure you get  plenty of rest.   Drink at least eight, eight-ounce glasses of fluids per day. Fluids include water, juice, broth, gelatin, or lemonade.   Cover your mouth and nose when coughing or sneezing and wash your hands often to prevent the spread of this virus to other persons.  PREVENTION  Annual influenza vaccination (flu shots) is the best way to avoid getting influenza. An annual flu shot is now routinely recommended for all adults in the [place] SEEK MEDICAL CARE IF:   You develop shortness of breath while resting.   You have a deep cough with production of mucous or chest pain.   You develop nausea (feeling sick to your stomach), vomiting, or diarrhea.  SEEK IMMEDIATE MEDICAL CARE IF:   You have difficulty breathing, become short of breath, or your skin or nails turn bluish.   You develop severe neck pain or stiffness.   You develop a severe headache, facial pain, or earache.   You have a fever.   You develop nausea or vomiting that cannot be controlled.  Document Released: 10/03/2000 Document Revised: 06/18/2011 Document Reviewed: 08/08/2009 ExitCare Patient Information 2012 [employer]. 

## 2015-01-08 NOTE — Progress Notes (Signed)
Subjective: Started feeling bad Saturday afternoon 1.5 day ago.   Hair dresser had been sick, and she was exposure to her.  Her son's girlfriend had flu recently, was around her too.  Started with horrible headache, pounding, raw throat, fever has ranged from 100-100.5, body aches, chills, weakness.  Has runny nose, congestion, coughing.  Denies wheezing, SOB.  No NVD.  No ear pain.  Husband drove her today here.  Using OTC sudafed, robitussin DM.  No other aggravating or relieving factors. No other complaint.  Past Medical History  Diagnosis Date  . DDD (degenerative disc disease), lumbar   . Sessile colonic polyp   . Hyperlipidemia   . Diabetes mellitus     diet controlled   . Hyperthyroidism     01/2012    ROS as in subjective   Objective: BP 112/80 mmHg  Pulse 104  Temp(Src) 98.7 F (37.1 C) (Oral)  Resp 15  Wt 148 lb (67.132 kg)  General appearance: alert, no distress, WD/WN, ill appearing HEENT: normocephalic, sclerae anicteric, TMs pearly, nares with turbinated edema and clear discharge and erythema, pharynx normal Oral cavity: somewhat dry mucous membranes, no lesions Neck: supple, no lymphadenopathy, no thyromegaly, no masses Heart: RRR, normal S1, S2, no murmurs Lungs: CTA bilaterally, no wheezes, rhonchi, or rales Pulses: 2+ symmetric, upper and lower extremities, normal cap refill    Assessment and Plan: Encounter Diagnosis  Name Primary?  . Influenza A Yes   Discussed diagnosis of influenza.  prescription given for Tamiflu, discussed risks/benefits of medication.  Discussed supportive care including rest, hydration, OTC Tylenol or NSAID for fever, aches, and malaise.  Discussed period of contagion, self quarantine at home away from others to avoid spread of disease, discussed means of transmission, and possible complications including pneumonia.  If worse or not improving within the next 4-5 days, then call or return.  Gave note for work.

## 2015-01-10 ENCOUNTER — Telehealth: Payer: Self-pay | Admitting: Internal Medicine

## 2015-01-10 NOTE — Telephone Encounter (Signed)
Patient is aware of Shane Tysinger PA message 

## 2015-01-10 NOTE — Telephone Encounter (Signed)
Pt called and wants to know if she can take Robitussin when taking tamiflu.

## 2015-01-10 NOTE — Telephone Encounter (Signed)
Yes that is fine

## 2015-04-03 LAB — HM COLONOSCOPY: HM Colonoscopy: 1

## 2015-04-04 ENCOUNTER — Encounter: Payer: Self-pay | Admitting: Internal Medicine

## 2015-04-12 ENCOUNTER — Encounter: Payer: Self-pay | Admitting: Medical

## 2015-06-12 ENCOUNTER — Encounter: Payer: Self-pay | Admitting: Medical

## 2015-06-12 ENCOUNTER — Ambulatory Visit (INDEPENDENT_AMBULATORY_CARE_PROVIDER_SITE_OTHER): Payer: BLUE CROSS/BLUE SHIELD | Admitting: Medical

## 2015-06-12 VITALS — BP 160/94 | HR 80 | Temp 98.1°F | Resp 18 | Wt 151.2 lb

## 2015-06-12 DIAGNOSIS — L309 Dermatitis, unspecified: Secondary | ICD-10-CM | POA: Diagnosis not present

## 2015-06-12 MED ORDER — TRIAMCINOLONE ACETONIDE 0.1 % EX CREA: 1.0000 "application " | TOPICAL_CREAM | Freq: Two times a day (BID) | CUTANEOUS | Status: DC

## 2015-06-12 MED ORDER — PREDNISONE 20 MG PO TABS: ORAL_TABLET | ORAL | Status: DC

## 2015-06-12 NOTE — Progress Notes (Signed)
   Subjective: Chief Complaint  Patient presents with  . Rash   Here for rash.  Went to [place] last week.  One of the first few days she was there, got splotchy whelps all over her body, but they would go away. This happened a few times.   Later in the weeks started getting itchy all over, then later developed small bumps/rash on arms, chest, and lets.   They have persisted.  She was in a hotel at [place], so several new exposures. No fever, no prior similar.  Not sure if this is bed bugs or poison iv as she has been doing some yard work this past weekend. No other aggravating or relieving factors. No other complaint.  Past Medical History  Diagnosis Date  . DDD (degenerative disc disease), lumbar   . Sessile colonic polyp   . Hyperlipidemia   . Diabetes mellitus     diet controlled   . Hyperthyroidism     01/2012    ROS as in subjective  Objective: BP 160/94 mmHg  Pulse 80  Temp(Src) 98.1 F (36.7 C)  Resp 18  Wt 151 lb 3.2 oz (68.584 kg)  Gen: wd, wn, nad Skin: few scattered 41mm diameter pink/red papular lesions of right forearm, left hand, between thumb and index finger, left upper thigh.  No induration, fluctuance, warmth.  No other abnormal skin findings    Assessment: Encounter Diagnosis  Name Primary?  . Dermatitis Yes    Plan: Advised benadryl QHS< begin 3-5 days of prednisone as discussed, triamcinolone cream.  If not much improved in 3-5 days, then recheck.  Return soon for physical   Sandra Valentine was seen today for rash.  Diagnoses and all orders for this visit:  Dermatitis  Other orders -     triamcinolone cream (KENALOG) 0.1 %; Apply 1 application topically 2 (two) times daily. -     predniSONE (DELTASONE) 20 MG tablet; 2 tablets daily for 5 days

## 2015-06-26 ENCOUNTER — Other Ambulatory Visit: Payer: Self-pay

## 2015-06-26 ENCOUNTER — Telehealth: Payer: Self-pay | Admitting: Medical

## 2015-06-26 ENCOUNTER — Other Ambulatory Visit: Payer: Self-pay | Admitting: Medical

## 2015-06-26 MED ORDER — HYDROXYZINE HCL 25 MG PO TABS: ORAL_TABLET | ORAL | Status: DC

## 2015-06-26 MED ORDER — PERMETHRIN 5 % EX CREA: 1.0000 "application " | TOPICAL_CREAM | Freq: Once | CUTANEOUS | Status: DC

## 2015-06-26 NOTE — Telephone Encounter (Signed)
ALERT PT HAS A NEW PHARMACY. Pt called and stated that her rash is about the same. She has finished the medication she was given and has be taking benadryl. She states she cant continue taking that because it makes her so sleepy. Pt is requesting something else be called in for her. Pt has A NEW PHARMACY. PT NOW USES [facility] ON [address].

## 2015-06-26 NOTE — Telephone Encounter (Signed)
Pt informed and verbalized understanding

## 2015-06-26 NOTE — Telephone Encounter (Signed)
If the cream didn't help at all, then it may be time for referral to dermatology.   See if she saw any improvement on the cream.  Can stop the Benadryl at this time.    Let me know.

## 2015-06-26 NOTE — Telephone Encounter (Signed)
Change to Hydroxyzine oral, 1/2-1 tablet po BID for itching.   Begin trial of Permethrin cream.  Apply to entire body at bedtime, leave on overnight, wash off in the morning in the shower.   Clean bed sheets on hot cycle.   Lets see if this makes a difference.  If not, then we will refer to dermatology

## 2015-06-26 NOTE — Telephone Encounter (Signed)
There was some improvement with cream it is the itching that is driving her crazy

## 2015-08-29 ENCOUNTER — Encounter: Payer: Self-pay | Admitting: Family Medicine

## 2015-08-29 ENCOUNTER — Ambulatory Visit (INDEPENDENT_AMBULATORY_CARE_PROVIDER_SITE_OTHER): Payer: BLUE CROSS/BLUE SHIELD | Admitting: Family Medicine

## 2015-08-29 VITALS — BP 140/90 | HR 64 | Temp 98.5°F | Wt 149.8 lb

## 2015-08-29 DIAGNOSIS — L298 Other pruritus: Secondary | ICD-10-CM | POA: Diagnosis not present

## 2015-08-29 DIAGNOSIS — N898 Other specified noninflammatory disorders of vagina: Secondary | ICD-10-CM

## 2015-08-29 DIAGNOSIS — J019 Acute sinusitis, unspecified: Secondary | ICD-10-CM

## 2015-08-29 MED ORDER — AMOXICILLIN 875 MG PO TABS: 875.0000 mg | ORAL_TABLET | Freq: Two times a day (BID) | ORAL | Status: DC

## 2015-08-29 MED ORDER — FLUCONAZOLE 150 MG PO TABS: 150.0000 mg | ORAL_TABLET | Freq: Once | ORAL | Status: DC

## 2015-08-29 NOTE — Progress Notes (Signed)
   Subjective:    Patient ID: Sandra Valentine, female    DOB: 10/28/1960, 54 y.o.   MRN: [id number]  HPI Chief Complaint  Patient presents with  . sick    started saturday,sinus pressure, headache, cough, stuffy nose. when she blew her nose right side had blood come out, sore throat. hot and cold   She is here with complaints of fever, chills, congestion, sore throat for 5 days. She has not actually checked her temperature at home. States dry cough and body aches began yesterday. States when she blows her nose she has drainage that is blood tinged.  Taking advil cold and sinus, Robitussin.  + sick contacts, grandson, states she had the flu in spring. Does not smoke. She is immunocompetent  Also complains of a week history of vaginal itching with white thick clumpy vaginal discharge and states she knows she has a yeast infection. Denies history of STD and states she does not need testing for same.   Reviewed allergies, medications, past medical and social history.    Review of Systems Pertinent positives and negatives in the history of present illness.    Objective:   Physical Exam  Constitutional: She appears well-developed and well-nourished. No distress.  HENT:  Right Ear: Tympanic membrane and ear canal normal.  Left Ear: Tympanic membrane and ear canal normal.  Nose: Mucosal edema present.  Mouth/Throat: Uvula is midline, oropharynx is clear and moist and mucous membranes are normal. No oropharyngeal exudate, posterior oropharyngeal edema or posterior oropharyngeal erythema.  Cardiovascular: Normal rate and regular rhythm.   Pulmonary/Chest: Effort normal and breath sounds normal. She has no wheezes. She has no rhonchi.  Lymphadenopathy:       Head (right side): No submandibular and no tonsillar adenopathy present.       Head (left side): No submandibular and no tonsillar adenopathy present.    She has no cervical adenopathy.       Right: No supraclavicular adenopathy present.         Left: No supraclavicular adenopathy present.  Skin: Skin is warm and dry. No cyanosis. No pallor. Nails show no clubbing.   BP 140/90 mmHg  Pulse 64  Temp(Src) 98.5 F (36.9 C) (Oral)  Wt 149 lb 12.8 oz (67.949 kg)     Assessment & Plan:  Acute rhinosinusitis - Plan: amoxicillin (AMOXIL) 875 MG tablet  Vaginal itching - Plan: fluconazole (DIFLUCAN) 150 MG tablet  Discussed that I will send an antibiotic prescription to her pharmacy to give her [name] symptoms 2-3 more days and see if she starts improving. Discussed appropriate antibiotic use and that these do not work for viruses. Also recommend symptomatic treatment including Tylenol or ibuprofen for low-grade fever, body aches, malaise and trying a humidifier and saline nasal spray for nasal congestion and dryness. Discussed staying well hydrated.  Diflucan prescription sent to pharmacy with instructions for use. Discussed taking a probiotic to replace good bacteria or eating yogurt daily.  She will return for a flu shot when she feels better.

## 2015-08-29 NOTE — Patient Instructions (Signed)
Upper Respiratory Infection, Adult Most upper respiratory infections (URIs) are a viral infection of the air passages leading to the lungs. A [name] affects the nose, throat, and upper air passages. The most common type of [name] is nasopharyngitis and is typically referred to as "the common cold." URIs run their course and usually go away on their own. Most of the time, a [name] does not require medical attention, but sometimes a bacterial infection in the upper airways can follow a viral infection. This is called a secondary infection. Sinus and middle ear infections are common types of secondary upper respiratory infections. Bacterial pneumonia can also complicate a [name]. A [name] can worsen asthma and chronic obstructive pulmonary disease (COPD). Sometimes, these complications can require emergency medical care and may be life threatening.  CAUSES Almost all URIs are caused by viruses. A virus is a type of germ and can spread from one person to another.  RISKS FACTORS You may be at risk for a [name] if:   You smoke.   You have chronic heart or lung disease.  You have a weakened defense (immune) system.   You are very young or very old.   You have nasal allergies or asthma.  You work in crowded or poorly ventilated areas.  You work in health care facilities or schools. SIGNS AND SYMPTOMS  Symptoms typically develop 2-3 days after you come in contact with a cold virus. Most viral URIs last 7-10 days. However, viral URIs from the influenza virus (flu virus) can last 14-18 days and are typically more severe. Symptoms may include:   Runny or stuffy (congested) nose.   Sneezing.   Cough.   Sore throat.   Headache.   Fatigue.   Fever.   Loss of appetite.   Pain in your forehead, behind your eyes, and over your cheekbones (sinus pain).  Muscle aches.  DIAGNOSIS  Your health care provider may diagnose a [name] by:  Physical exam.  Tests to check that your symptoms are not due to  another condition such as:  Strep throat.  Sinusitis.  Pneumonia.  Asthma. TREATMENT  A [name] goes away on its own with time. It cannot be cured with medicines, but medicines may be prescribed or recommended to relieve symptoms. Medicines may help:  Reduce your fever.  Reduce your cough.  Relieve nasal congestion. HOME CARE INSTRUCTIONS   Take medicines only as directed by your health care provider.   Gargle warm saltwater or take cough drops to comfort your throat as directed by your health care provider.  Use a warm mist humidifier or inhale steam from a shower to increase air moisture. This may make it easier to breathe.  Drink enough fluid to keep your urine clear or pale yellow.   Eat soups and other clear broths and maintain good nutrition.   Rest as needed.   Return to work when your temperature has returned to normal or as your health care provider advises. You may need to stay home longer to avoid infecting others. You can also use a face mask and careful hand washing to prevent spread of the virus.  Increase the usage of your inhaler if you have asthma.   Do not use any tobacco products, including cigarettes, chewing tobacco, or electronic cigarettes. If you need help quitting, ask your health care provider. PREVENTION  The best way to protect yourself from getting a cold is to practice good hygiene.   Avoid oral or hand contact with people with cold   symptoms.   Wash your hands often if contact occurs.  There is no clear evidence that vitamin C, vitamin E, echinacea, or exercise reduces the chance of developing a cold. However, it is always recommended to get plenty of rest, exercise, and practice good nutrition.  SEEK MEDICAL CARE IF:   You are getting worse rather than better.   Your symptoms are not controlled by medicine.   You have chills.  You have worsening shortness of breath.  You have brown or red mucus.  You have yellow or brown nasal  discharge.  You have pain in your face, especially when you bend forward.  You have a fever.  You have swollen neck glands.  You have pain while swallowing.  You have white areas in the back of your throat. SEEK IMMEDIATE MEDICAL CARE IF:   You have severe or persistent:  Headache.  Ear pain.  Sinus pain.  Chest pain.  You have chronic lung disease and any of the following:  Wheezing.  Prolonged cough.  Coughing up blood.  A change in your usual mucus.  You have a stiff neck.  You have changes in your:  Vision.  Hearing.  Thinking.  Mood. MAKE SURE YOU:   Understand these instructions.  Will watch your condition.  Will get help right away if you are not doing well or get worse.   This information is not intended to replace advice given to you by your health care provider. Make sure you discuss any questions you have with your health care provider.   Document Released: 04/01/2001 Document Revised: 02/20/2015 Document Reviewed: 01/11/2014 Elsevier Interactive Patient Education 2016 [employer]  

## 2015-11-14 ENCOUNTER — Encounter: Payer: Self-pay | Admitting: Medical

## 2015-11-14 ENCOUNTER — Ambulatory Visit (INDEPENDENT_AMBULATORY_CARE_PROVIDER_SITE_OTHER): Payer: BLUE CROSS/BLUE SHIELD | Admitting: Medical

## 2015-11-14 VITALS — BP 120/80 | HR 88 | Wt 148.0 lb

## 2015-11-14 DIAGNOSIS — B86 Scabies: Secondary | ICD-10-CM

## 2015-11-14 MED ORDER — IVERMECTIN 3 MG PO TABS
ORAL_TABLET | ORAL | Status: DC
Start: 2015-11-14 — End: 2016-02-25

## 2015-11-14 NOTE — Progress Notes (Signed)
Subjective:   Sandra Valentine is a 55 y.o. female who presents for evaluation of a rash involving the arms, legs, torso, fingers, and hands. Rash started 4 weeks ago. Lesions are brown or red, some crusted, some not, new ones on hands and forearms. Rash has changed over time. Rash is pruritic. Associated symptoms: none. Patient denies: abdominal pain, arthralgia, fever, myalgia and nausea. Patient has had contacts with similar rash. Patient has had new exposures - they were keeping a child/family member that had scabies.    The following portions of the patient's history were reviewed and updated as appropriate: allergies, current medications, past family history, past medical history, past social history and problem list.  Review of Systems As in subjective above   Objective:   Gen: wd, wn, nad Skin: few small raised lesions on hands, some suggestive of burrows, several small red/brown 1-2 mm papular lesions on forearms, hands, right upper thigh , abdomen   Assessment:    Encounter Diagnosis  Name Primary?  . Scabies infestation Yes    Plan:   Discussed symptoms and exam findings.  Etiology most likely scabies, some crusted.   She has failed 3 permethrin topical treatments.  Begin medication below.  F/u in 2-3wk.  discussed precautions, hygiene, preventative measures.   Return soon for physical.  Sandra Valentine was seen today for scabies?.  Diagnoses and all orders for this visit:  Scabies infestation  Other orders -     ivermectin (STROMECTOL) 3 MG TABS tablet; Take 4 tablets on day 1, 2, 8, 9, 15  '

## 2015-11-21 ENCOUNTER — Telehealth: Payer: Self-pay | Admitting: Family Medicine

## 2015-11-21 NOTE — Telephone Encounter (Signed)
Pt called and wanted refill on Promethazine cream at [facility].

## 2015-11-22 ENCOUNTER — Other Ambulatory Visit: Payer: Self-pay | Admitting: Medical

## 2015-11-22 MED ORDER — PERMETHRIN 5 % EX CREA: 1.0000 "application " | TOPICAL_CREAM | Freq: Once | CUTANEOUS | Status: DC

## 2016-01-24 ENCOUNTER — Other Ambulatory Visit: Payer: Self-pay

## 2016-01-24 DIAGNOSIS — Z1231 Encounter for screening mammogram for malignant neoplasm of breast: Secondary | ICD-10-CM

## 2016-02-01 ENCOUNTER — Ambulatory Visit
Admission: RE | Admit: 2016-02-01 | Discharge: 2016-02-01 | Disposition: A | Discharge status: Home / Self Care | Payer: BLUE CROSS/BLUE SHIELD | Source: Ambulatory Visit

## 2016-02-01 DIAGNOSIS — Z1231 Encounter for screening mammogram for malignant neoplasm of breast: Secondary | ICD-10-CM

## 2016-02-25 ENCOUNTER — Telehealth: Payer: Self-pay | Admitting: Medical

## 2016-02-25 ENCOUNTER — Encounter: Payer: Self-pay | Admitting: Medical

## 2016-02-25 ENCOUNTER — Ambulatory Visit (INDEPENDENT_AMBULATORY_CARE_PROVIDER_SITE_OTHER): Payer: BLUE CROSS/BLUE SHIELD | Admitting: Medical

## 2016-02-25 VITALS — BP 130/80 | HR 90 | Wt 149.0 lb

## 2016-02-25 DIAGNOSIS — M5442 Lumbago with sciatica, left side: Secondary | ICD-10-CM

## 2016-02-25 DIAGNOSIS — M792 Neuralgia and neuritis, unspecified: Secondary | ICD-10-CM

## 2016-02-25 DIAGNOSIS — M5410 Radiculopathy, site unspecified: Secondary | ICD-10-CM

## 2016-02-25 MED ORDER — CYCLOBENZAPRINE HCL 10 MG PO TABS: ORAL_TABLET | ORAL | Status: DC

## 2016-02-25 MED ORDER — METHOCARBAMOL 500 MG PO TABS: 500.0000 mg | ORAL_TABLET | Freq: Three times a day (TID) | ORAL | Status: DC | PRN

## 2016-02-25 MED ORDER — PREDNISONE 10 MG PO TABS: ORAL_TABLET | ORAL | Status: DC

## 2016-02-25 NOTE — Telephone Encounter (Signed)
Pt called and states that her boss has the note and that you don't have to worry about faxing it in her job has the one she sent from her phone.

## 2016-02-25 NOTE — Progress Notes (Signed)
Subjective: Chief Complaint  Patient presents with  . Back Pain    has had issues with her back for awhile, flared up again on saturday. said she tries to help her husband get out of bed and she heard something snap on thursday but the pain waited. said she can barely get in her car. said it shoots down her left leg.     She notes having some back issues for a while, had MRI in the past, gets back pains from time to time.  Husband has rheumatoid arthritis, and he is having mobility problems.  He has a walker.  He was getting up from seated position, and she was helping him move forward out of the chair.  This was last Thursday, 4 days ago.   Date of injury 02/21/16.   While helping him up from seated position, heard a pop.   But she noted 2 days later having some pain in low back.   Has pain going down left leg.   Pain is more of the side of the left leg.  denies numbness or tingling.   The back pain is constant currently.  Hurts now sitting in the chair.   Had to stand and read at church Sunday which also aggravated the pain.   The changes from cold to hot weather recently isn't helping either.  In the past has had back issues and radicular issues, where she would have problems lifting her leg into the car.  Has hx/o "2 rupture and bulging discs."   Took some Tylenol for pain.  In the past Fonda Kinder PA here would give her prednisone and few days out of work.  No other aggravating or relieving factors. No other complaint.   Past Medical History  Diagnosis Date  . DDD (degenerative disc disease), lumbar   . Sessile colonic polyp   . Hyperlipidemia   . Diabetes mellitus     diet controlled   . Hyperthyroidism     01/2012    Past Surgical History  Procedure Laterality Date  . Cholecystectomy    . Colonoscopy  02/2010  . Tubal ligation     ROS as in subjective   Objective: BP 130/80 mmHg  Pulse 90  Wt 149 lb (67.586 kg)  General appearance: alert, no distress, WD/WN Abdomen: +bs, soft,  non tender, non distended, no masses, no hepatomegaly, no splenomegaly Back: tender bilat paraspinal lumbar region, mild midline lumbar tenders, ROM is quite limited due to pain, no deformity Legs nontender, no hip pain with ROM, but there is pain with left leg extension at 30 degrees.  otherwise no deformity or pain with leg ROM Normal leg strength and sensation Pulses: 2+ symmetric, upper and lower extremities, normal cap refill    Assessment: Encounter Diagnoses  Name Primary?  . Left-sided low back pain with left-sided sciatica Yes  . Radicular pain      Plan: Discussed symptoms, treatment recommendations.  Begin prednisone, robaxin.  No specific injury directly related to the pain, but she has hx/o bulging discs.  Will use round of prednisone and prn muscle relaxer.   Advised relative rest, avoid lifting, crawling, pulling, pushing  the next 4-5 days.   Advised gentle stretching and ROM activity.   If worse or not resolved within a week, then recheck.  Return soon for physical.  Gave note for work.

## 2016-12-23 ENCOUNTER — Other Ambulatory Visit: Payer: Self-pay | Admitting: Family Medicine

## 2016-12-23 DIAGNOSIS — Z1231 Encounter for screening mammogram for malignant neoplasm of breast: Secondary | ICD-10-CM

## 2017-02-03 ENCOUNTER — Ambulatory Visit: Payer: BLUE CROSS/BLUE SHIELD

## 2017-02-06 ENCOUNTER — Ambulatory Visit
Admission: RE | Admit: 2017-02-06 | Discharge: 2017-02-06 | Disposition: A | Discharge status: Home / Self Care | Payer: BLUE CROSS/BLUE SHIELD | Source: Ambulatory Visit | Attending: Family Medicine | Admitting: Family Medicine

## 2017-02-06 DIAGNOSIS — Z1231 Encounter for screening mammogram for malignant neoplasm of breast: Secondary | ICD-10-CM

## 2017-07-10 ENCOUNTER — Ambulatory Visit (INDEPENDENT_AMBULATORY_CARE_PROVIDER_SITE_OTHER): Payer: BLUE CROSS/BLUE SHIELD | Admitting: Medical

## 2017-07-10 ENCOUNTER — Encounter: Payer: Self-pay | Admitting: Medical

## 2017-07-10 VITALS — BP 130/80 | HR 87 | Temp 98.4°F | Wt 150.8 lb

## 2017-07-10 DIAGNOSIS — E079 Disorder of thyroid, unspecified: Secondary | ICD-10-CM | POA: Diagnosis not present

## 2017-07-10 DIAGNOSIS — R059 Cough, unspecified: Secondary | ICD-10-CM

## 2017-07-10 DIAGNOSIS — R0981 Nasal congestion: Secondary | ICD-10-CM

## 2017-07-10 DIAGNOSIS — R7301 Impaired fasting glucose: Secondary | ICD-10-CM | POA: Diagnosis not present

## 2017-07-10 DIAGNOSIS — R05 Cough: Secondary | ICD-10-CM | POA: Diagnosis not present

## 2017-07-10 MED ORDER — AMOXICILLIN 875 MG PO TABS: 875.0000 mg | ORAL_TABLET | Freq: Two times a day (BID) | ORAL | 0 refills | Status: DC

## 2017-07-10 NOTE — Progress Notes (Signed)
Subjective: Chief Complaint  Patient presents with  . congestion    congestion in head and chest , runny nose , no coughing    Having some head and chest congestion, scratchy throat, for last few days, feeling some SOB.  Used some OTC congestion medication.  Has head pressure, pressure behind eyes.  No fever.  Has some hot flashes.   No NVD.  No polydipsia, no polyuria, no blurred vision.   Has had sick contacts.  Nonsmoker.   No hx/o asthma.  Has hx/o thyroid disease and impaired glucose/borderline diabetes, but hasn't had f/u in 2+ years on this.  No other aggravating or relieving factors. No other complaint.  Past Medical History:  Diagnosis Date  . DDD (degenerative disc disease), lumbar   . Diabetes mellitus    diet controlled   . Hyperlipidemia   . Hyperthyroidism    01/2012   . Sessile colonic polyp    Current Outpatient Prescriptions on File Prior to Visit  Medication Sig Dispense Refill  . levothyroxine (SYNTHROID, LEVOTHROID) 75 MCG tablet Take 88 mcg by mouth daily.      No current facility-administered medications on file prior to visit.    ROS as in subjective   Objective: BP 130/80   Pulse 87   Temp 98.4 F (36.9 C)   Wt 150 lb 12.8 oz (68.4 kg)   SpO2 98%   BMI 25.09 kg/m   General appearance: alert, no distress, WD/WN,  HEENT: normocephalic, sclerae anicteric, TMs pearly, nares with turbinated edema, mucoid discharge, mild erythema, pharynx normal Oral cavity: MMM, no lesions  Neck: supple, no lymphadenopathy, no thyromegaly, no masses Heart: RRR, normal S1, S2, no murmurs Lungs: CTA bilaterally, no wheezes, rhonchi, or rales Pulses: 2+ symmetric, upper and lower extremities, normal cap refill Ext: no edema    Assessment: Encounter Diagnoses  Name Primary?  . Cough Yes  . Head congestion   . Impaired fasting blood sugar   . Thyroid disorder     Plan: discussed OTC supportive measures, Mucinex DM, rest, hydration, nasal saline flush.  Begin  amoxicillin.   Labs today to help update on impaired glucose, thyroid.    Discussed need for taking better care of her self, coming in for preventive care, f/u on elevated sugars.  F/u pending labs  Cydnee was seen today for congestion.  Diagnoses and all orders for this visit:  Cough -     CBC with Differential/Platelet -     Basic metabolic panel  Head congestion -     CBC with Differential/Platelet -     Basic metabolic panel  Impaired fasting blood sugar -     Hemoglobin A1c  Thyroid disorder -     TSH -     T4, Free  Other orders -     amoxicillin (AMOXIL) 875 MG tablet; Take 1 tablet (875 mg total) by mouth 2 (two) times daily.

## 2017-07-11 ENCOUNTER — Other Ambulatory Visit: Payer: Self-pay | Admitting: Medical

## 2017-07-11 LAB — BASIC METABOLIC PANEL
BUN: 11 mg/dL (ref 7–25)
CHLORIDE: 100 mmol/L (ref 98–110)
CO2: 27 mmol/L (ref 20–32)
Calcium: 9.3 mg/dL (ref 8.6–10.4)
Creat: 0.8 mg/dL (ref 0.50–1.05)
GLUCOSE: 370 mg/dL — AB (ref 65–99)
Potassium: 3.9 mmol/L (ref 3.5–5.3)
SODIUM: 137 mmol/L (ref 135–146)

## 2017-07-11 LAB — CBC WITH DIFFERENTIAL/PLATELET
BASOS PCT: 0.3 %
Basophils Absolute: 24 cells/uL (ref 0–200)
EOS ABS: 111 {cells}/uL (ref 15–500)
Eosinophils Relative: 1.4 %
HCT: 39 % (ref 35.0–45.0)
HEMOGLOBIN: 12.7 g/dL (ref 11.7–15.5)
LYMPHS ABS: 1651 {cells}/uL (ref 850–3900)
MCH: 28.5 pg (ref 27.0–33.0)
MCHC: 32.6 g/dL (ref 32.0–36.0)
MCV: 87.4 fL (ref 80.0–100.0)
MPV: 12 fL (ref 7.5–12.5)
Monocytes Relative: 4.7 %
NEUTROS ABS: 5743 {cells}/uL (ref 1500–7800)
Neutrophils Relative %: 72.7 %
Platelets: 219 10*3/uL (ref 140–400)
RBC: 4.46 10*6/uL (ref 3.80–5.10)
RDW: 13 % (ref 11.0–15.0)
Total Lymphocyte: 20.9 %
WBC: 7.9 10*3/uL (ref 3.8–10.8)
WBCMIX: 371 {cells}/uL (ref 200–950)

## 2017-07-11 LAB — HEMOGLOBIN A1C
EAG (MMOL/L): 17.3 (calc)
Hgb A1c MFr Bld: 12.5 % of total Hgb — ABNORMAL HIGH (ref <5.7)
MEAN PLASMA GLUCOSE: 312 (calc)

## 2017-07-11 LAB — TSH: TSH: 23.14 mIU/L — ABNORMAL HIGH (ref 0.40–4.50)

## 2017-07-11 LAB — T4, FREE: Free T4: 1 ng/dL (ref 0.8–1.8)

## 2017-07-11 MED ORDER — LEVOTHYROXINE SODIUM 88 MCG PO TABS: 88.0000 ug | ORAL_TABLET | Freq: Every day | ORAL | 11 refills | Status: DC

## 2017-07-11 MED ORDER — INSULIN DEGLUDEC 100 UNIT/ML ~~LOC~~ SOPN: 10.0000 [IU] | PEN_INJECTOR | Freq: Every day | SUBCUTANEOUS | 2 refills | Status: DC

## 2017-07-14 ENCOUNTER — Other Ambulatory Visit: Payer: Self-pay

## 2017-07-18 ENCOUNTER — Telehealth: Payer: Self-pay | Admitting: Medical

## 2017-07-18 NOTE — Telephone Encounter (Signed)
P.A. TRESIBA  °

## 2017-07-25 NOTE — Telephone Encounter (Signed)
P.A. Tyler Aas denied, pt needs trial of at least 2 in class Lantus & Toujeo.  Do you want to switch?

## 2017-07-27 NOTE — Telephone Encounter (Signed)
No as she declined the medication when I sent it and is suppose to f/u soon.

## 2018-02-16 ENCOUNTER — Other Ambulatory Visit: Payer: Self-pay | Admitting: Family Medicine

## 2018-02-16 ENCOUNTER — Ambulatory Visit
Admission: RE | Admit: 2018-02-16 | Discharge: 2018-02-16 | Disposition: A | Discharge status: Home / Self Care | Payer: BLUE CROSS/BLUE SHIELD | Source: Ambulatory Visit | Attending: Family Medicine | Admitting: Family Medicine

## 2018-02-16 DIAGNOSIS — Z1231 Encounter for screening mammogram for malignant neoplasm of breast: Secondary | ICD-10-CM

## 2018-03-24 ENCOUNTER — Encounter: Payer: Self-pay | Admitting: Medical

## 2018-03-24 ENCOUNTER — Ambulatory Visit (INDEPENDENT_AMBULATORY_CARE_PROVIDER_SITE_OTHER): Payer: BLUE CROSS/BLUE SHIELD | Admitting: Medical

## 2018-03-24 VITALS — BP 126/60 | HR 84 | Temp 98.0°F | Resp 18 | Ht 64.0 in | Wt 145.2 lb

## 2018-03-24 DIAGNOSIS — E1165 Type 2 diabetes mellitus with hyperglycemia: Secondary | ICD-10-CM | POA: Diagnosis not present

## 2018-03-24 DIAGNOSIS — J029 Acute pharyngitis, unspecified: Secondary | ICD-10-CM

## 2018-03-24 DIAGNOSIS — Z9119 Patient's noncompliance with other medical treatment and regimen: Secondary | ICD-10-CM

## 2018-03-24 DIAGNOSIS — E079 Disorder of thyroid, unspecified: Secondary | ICD-10-CM

## 2018-03-24 DIAGNOSIS — Z91199 Patient's noncompliance with other medical treatment and regimen due to unspecified reason: Secondary | ICD-10-CM

## 2018-03-24 LAB — POCT GLYCOSYLATED HEMOGLOBIN (HGB A1C): HEMOGLOBIN A1C: 11 % — AB (ref 4.0–5.6)

## 2018-03-24 LAB — POCT RAPID STREP A (OFFICE): RAPID STREP A SCREEN: NEGATIVE

## 2018-03-24 NOTE — Patient Instructions (Addendum)
Encounter Diagnoses  Name Primary?  . Sore throat Yes  . Thyroid disease   . Uncontrolled type 2 diabetes mellitus with hyperglycemia ([place])   . Noncompliance with diabetes treatment    Recommendations: You have uncontrolled diabetes  This is not going to fix itself.  We certainly recommend you exercise regularly, eat a healthy diet, stay away from soda, sweet tea, juice, fast food, junk food, cakes, pies, cookies, chips, ice cream.  I recommend you eat 2-3 fruits per day, recommend you eat vegetables every meal.  I recommend you eat fish regularly.  I recommend you eat small portions of whole grains 2-3 times a day such as 3/4 cup of brown rice or 3/4 cups of whole-grain pasta, or 45-calorie whole-grain bread.  If your sugars are running in the 200s, then you should be on medication.  The fact of the matter is that what you are doing on your own efforts is not controlling your sugars.  Thus you should be on medication to help control your sugars.  Check your morning sugars as they need to be under 130 fasting.  Is your choice whether you take medicine or not for diabetes.  If your sugars stay uncontrolled or elevated, then you will eventually have complications of diabetes.  Complications include the following  Heart attack, heart disease, stroke,   kidney damage, need for kidney dialysis,   Damage to your eyes, blindness  neuropathy or numbness in your extremities,   problems with your bowels not digesting appropriately,  recurrent illnesses or hard for your body to fight off infection  I recommend you begin a trial of Janumet XR.  This is a once daily tablet  Begin 50/1000mg  1 tablet daily, then switch to 100/1000mg  daily when you run out Follow up 1 month

## 2018-03-24 NOTE — Progress Notes (Signed)
Subjective: Sandra Valentine is a 57 y.o. female who presents for evaluation of sore throat.   Symptoms include headache, head pressure, sore throat, some post nasal drainage, not much cough. Denies fever, NVD, no body aches, no chills.   Using sudafed for symptoms. +sick contacts.  Patient has not had a recent close exposure to someone with proven streptococcal pharyngitis.  Past history is significant for diet controlled diabetes.   Patient is a smoker.  She notes being out of thyroid medication.  Sees Dr. Chalmers Cater, endocrinology but they apparently only see her for thyroid disease although she says they are aware she has diabetes.  She declined medication when I saw her 06/2017, despite HgBA1C 12%, she refuses any shot, and prior wouldn't agree to metformin either specifically.  She says she checks her sugars and they average in the 200s.   No other aggravating or relieving factors.  No other c/o.  The following portions of the patient's history were reviewed and updated as appropriate: allergies, current medications, past medical history, past social history, past surgical history and problem list.  Past Medical History:  Diagnosis Date  . DDD (degenerative disc disease), lumbar   . Diabetes mellitus    diet controlled   . Hyperlipidemia   . Hyperthyroidism    01/2012   . Sessile colonic polyp    ROS as in subjective     Objective: BP 126/60   Pulse 84   Temp 98 F (36.7 C)   Resp 18   Ht 5\' 4"  (1.626 m)   Wt 145 lb 3.2 oz (65.9 kg)   BMI 24.92 kg/m   General appearance: no distress, WD/WN, mildly ill-appearing HEENT: normocephalic, conjunctiva/corneas normal, sclerae anicteric, nares patent, no discharge or erythema, pharynx with erythema, no exudate.  Oral cavity: MMM, no lesions  Neck: supple, no lymphadenopathy, no thyromegaly lungs: CTA bilaterally, no wheezes, rhonchi, or rales  Laboratory Strep test done. Results:negative.   Assessment: Encounter Diagnoses    Name Primary?  . Sore throat Yes  . Thyroid disease   . Uncontrolled type 2 diabetes mellitus with hyperglycemia ([place])   . Noncompliance with diabetes treatment      Plan: Sore throat - begin Amoxicillin, rest, hydrate well, discussed supportive care.  Uncontrolled diabetes, noncompliance - reviewed recommendations below.    Thyroid disease - managed by endocrinology  Patient Instructions   Encounter Diagnoses  Name Primary?  . Sore throat Yes  . Thyroid disease   . Uncontrolled type 2 diabetes mellitus with hyperglycemia ([place])   . Noncompliance with diabetes treatment    Recommendations: You have uncontrolled diabetes  This is not going to fix itself.  We certainly recommend you exercise regularly, eat a healthy diet, stay away from soda, sweet tea, juice, fast food, junk food, cakes, pies, cookies, chips, ice cream.  I recommend you eat 2-3 fruits per day, recommend you eat vegetables every meal.  I recommend you eat fish regularly.  I recommend you eat small portions of whole grains 2-3 times a day such as 3/4 cup of brown rice or 3/4 cups of whole-grain pasta, or 45-calorie whole-grain bread.  If your sugars are running in the 200s, then you should be on medication.  The fact of the matter is that what you are doing on your own efforts is not controlling your sugars.  Thus you should be on medication to help control your sugars.  Check your morning sugars as they need to be under 130 fasting.  Is  your choice whether you take medicine or not for diabetes.  If your sugars stay uncontrolled or elevated, then you will eventually have complications of diabetes.  Complications include the following  Heart attack, heart disease, stroke,   kidney damage, need for kidney dialysis,   Damage to your eyes, blindness  neuropathy or numbness in your extremities,   problems with your bowels not digesting appropriately,  recurrent illnesses or hard for your body to fight off  infection  I recommend you begin a trial of Janumet XR.  This is a once daily tablet  Begin 50/1000mg  1 tablet daily, then switch to 100/1000mg  daily when you run out Follow up 1 month

## 2018-03-25 ENCOUNTER — Telehealth: Payer: Self-pay | Admitting: Medical

## 2018-03-25 ENCOUNTER — Other Ambulatory Visit: Payer: Self-pay | Admitting: Medical

## 2018-03-25 MED ORDER — SITAGLIP PHOS-METFORMIN HCL ER 50-1000 MG PO TB24: 1.0000 | ORAL_TABLET | Freq: Every day | ORAL | 2 refills | Status: DC

## 2018-03-25 MED ORDER — AMOXICILLIN 500 MG PO TABS: ORAL_TABLET | ORAL | 0 refills | Status: DC

## 2018-03-25 NOTE — Telephone Encounter (Signed)
Med sent, sorry for the delay

## 2018-03-25 NOTE — Telephone Encounter (Signed)
Pt called stating that her antibiotic still has not been sent in to her pharmacy yet and it should go to [place] at [facility]

## 2018-03-26 ENCOUNTER — Telehealth: Payer: Self-pay | Admitting: Medical

## 2018-03-26 NOTE — Telephone Encounter (Signed)
Pt called and stated that pt was advised to take Janumet in the AM. She states that when she did that she got sick to her stomach. She went online and it was recommend to take in the evening with food. She is going to try that. She just wanted you to be aware. Pt can be reached at [phone].

## 2018-03-29 ENCOUNTER — Telehealth: Payer: Self-pay | Admitting: Family Medicine

## 2018-03-29 ENCOUNTER — Other Ambulatory Visit: Payer: Self-pay | Admitting: Family Medicine

## 2018-03-29 ENCOUNTER — Other Ambulatory Visit: Payer: Self-pay | Admitting: Medical

## 2018-03-29 MED ORDER — GLIPIZIDE 5 MG PO TABS: 5.0000 mg | ORAL_TABLET | Freq: Every day | ORAL | 1 refills | Status: DC

## 2018-03-29 NOTE — Telephone Encounter (Signed)
The medication should not really cause those symptoms.  Nonetheless, let us have her switch to glipizide once daily and start out with something basic.  I sent this to the pharmacy.  Have her follow-up in 1 month as planned but call again if problems with this medication.

## 2018-03-29 NOTE — Telephone Encounter (Signed)
Called patient advised of same, she was not happy about [place] putting her on the wrong medicine.  I tried to explain there is no way for [place] to know how the medication will react on individuals.  That the reaction she is having is not normal.  Med was moved to [facility] on [address]  Advised pt Debria Garret is generic and a less expensive drug

## 2018-03-29 NOTE — Telephone Encounter (Signed)
Pt called upset that [facility]ide went to [facility] couldn't fill it,  Advised I can call & cancel. She said not to worry about it because it was cheaper and she would just get it there this time.  Also states Amoxil wasn't at pharmacy.  Advised would call and take care of.  [facility] and Am[facility] is ready.  Advised her pharmacy has been fixed

## 2018-03-29 NOTE — Telephone Encounter (Signed)
Pt called and states the Janumet is making her depressed, cant function at work, not eating right. Please advise her what to do.  [phone]

## 2018-06-14 ENCOUNTER — Ambulatory Visit (INDEPENDENT_AMBULATORY_CARE_PROVIDER_SITE_OTHER): Payer: Managed Care, Other (non HMO) | Admitting: Medical

## 2018-06-14 VITALS — BP 136/80 | HR 68 | Temp 98.0°F | Resp 16 | Ht 64.0 in | Wt 153.0 lb

## 2018-06-14 DIAGNOSIS — E1169 Type 2 diabetes mellitus with other specified complication: Secondary | ICD-10-CM

## 2018-06-14 DIAGNOSIS — Z9119 Patient's noncompliance with other medical treatment and regimen: Secondary | ICD-10-CM

## 2018-06-14 DIAGNOSIS — E049 Nontoxic goiter, unspecified: Secondary | ICD-10-CM | POA: Diagnosis not present

## 2018-06-14 DIAGNOSIS — E041 Nontoxic single thyroid nodule: Secondary | ICD-10-CM | POA: Diagnosis not present

## 2018-06-14 DIAGNOSIS — Z2821 Immunization not carried out because of patient refusal: Secondary | ICD-10-CM

## 2018-06-14 DIAGNOSIS — E0590 Thyrotoxicosis, unspecified without thyrotoxic crisis or storm: Secondary | ICD-10-CM | POA: Diagnosis not present

## 2018-06-14 DIAGNOSIS — Z91199 Patient's noncompliance with other medical treatment and regimen due to unspecified reason: Secondary | ICD-10-CM

## 2018-06-14 DIAGNOSIS — E119 Type 2 diabetes mellitus without complications: Secondary | ICD-10-CM | POA: Insufficient documentation

## 2018-06-14 NOTE — Progress Notes (Signed)
Subjective: Sandra Valentine is a 57 y.o. female  Chief Complaint  Patient presents with  . follow up    1 month follow up A1C    Sandra Valentine has a history of diabetes, thyroid disease, noncompliance.  When I saw her 3 months ago I had recommended long-acting once daily insulin but she declined this.  Insurance would not cover it as well.  We then recommended she take something to help control sugars and she was agreeable to glipizide.  Historically she has not followed up with recommendations.  She currently does report compliance with glipizide 5 mg daily.  She notes 4 days into Janumet had problems with headache, nausea, upset stomach.  After trying it a few more days couldn't tolerate this.    Checking glucose fasting, seeing numbers as low as 157, as high as 183 particularly if over eating. No feet concerns.  She does love bread, but glipizide has helped her not crave as much bread.  Does have much taste for sweets of late.  Thyroid disease-she notes being out of thyroid medication.  Was seeing Dr. Chalmers Cater, endocrinology but they apparently only see her for thyroid disease although she says they are aware she has diabetes.    Not currently on thyroid medication.  Denies fatigue or other symptoms.  Exercise - housework  No other aggravating or relieving factors.  No other c/o.  The following portions of the patient's history were reviewed and updated as appropriate: allergies, current medications, past medical history, past social history, past surgical history and problem list.  Past Medical History:  Diagnosis Date  . DDD (degenerative disc disease), lumbar   . Diabetes mellitus    diet controlled   . Hyperlipidemia   . Hyperthyroidism    01/2012   . Sessile colonic polyp    Current Outpatient Medications on File Prior to Visit  Medication Sig Dispense Refill  . glipiZIDE (GLUCOTROL) 5 MG tablet Take 1 tablet (5 mg total) by mouth daily before breakfast. 30 tablet 1  . insulin  degludec (TRESIBA FLEXTOUCH) 100 UNIT/ML SOPN FlexTouch Pen Inject 0.1 mLs (10 Units total) into the skin daily at 10 pm. (Patient not taking: Reported on 03/24/2018) 3 mL 2  . levothyroxine (SYNTHROID, LEVOTHROID) 100 MCG tablet Take 1 tablet by mouth daily.    . SitaGLIPtin-MetFORMIN HCl (JANUMET XR) 50-1000 MG TB24 Take 1 tablet by mouth daily. (Patient not taking: Reported on 06/14/2018) 30 tablet 2   No current facility-administered medications on file prior to visit.    Family History  Problem Relation Age of Onset  . Other Mother        cause of death unkown, but likely renal  . Kidney disease Mother   . Hypertension Mother   . Cancer Father 69       died of colon cancer  . Cancer Paternal Grandfather   . Heart disease Neg Hx   . Diabetes Neg Hx   . Stroke Neg Hx     ROS as in subjective     Objective: BP 136/80   Pulse 68   Temp 98 F (36.7 C) (Oral)   Resp 16   Ht 5\' 4"  (1.626 m)   Wt 153 lb (69.4 kg)   SpO2 98%   BMI 26.26 kg/m   Wt Readings from Last 3 Encounters:  06/14/18 153 lb (69.4 kg)  03/24/18 145 lb 3.2 oz (65.9 kg)  07/10/17 150 lb 12.8 oz (68.4 kg)   General appearance: no  distress, WD/WN, mildly ill-appearing Neck: supple, no lymphadenopathy, goiter palpated, small, but no obvious palpable nodules lungs: CTA bilaterally, no wheezes, rhonchi, or rales Heart rrr, normal s1, s2, no murmurs Ext: no edema Pulses 2+, regular Declines foot exam    Assessment: Encounter Diagnoses  Name Primary?  . Type 2 diabetes mellitus with other specified complication, without long-term current use of insulin ([name]) Yes  . Hyperthyroidism   . Thyroid nodule   . Goiter   . Noncompliance   . Influenza vaccination declined      Plan: Diabetes - Uncontrolled diabetes, noncompliance.  Labs today.  Tolerating Glipizde.  Didn't tolerate Janumet, not willing to add on a medication at this time.    Declines flu shot today  Thyroid disease - was managed by  endocrinology.  Its unclear if her plans are to recheck with endocrinology or not.   Not currently on medication.  Hx/o noncompliance  Reviewed 01/2018 mammogram, normal  Reviewed 03/2015 colonoscopy  She will schedule soon for pap smear.  Sandra Valentine was seen today for follow up.  Diagnoses and all orders for this visit:  Type 2 diabetes mellitus with other specified complication, without long-term current use of insulin ([facility]) -     Comprehensive metabolic panel -     CBC -     Lipid panel -     TSH -     Hemoglobin A1c -     Microalbumin / creatinine urine ratio -     HM DIABETES FOOT EXAM -     HM DIABETES EYE EXAM  Hyperthyroidism -     TSH -     T4, free  Thyroid nodule -     TSH -     T4, free  Goiter -     TSH -     T4, free  Noncompliance  Influenza vaccination declined

## 2018-06-15 ENCOUNTER — Other Ambulatory Visit: Payer: Self-pay | Admitting: Medical

## 2018-06-15 LAB — LIPID PANEL
CHOL/HDL RATIO: 4.3 ratio (ref 0.0–4.4)
Cholesterol, Total: 294 mg/dL — ABNORMAL HIGH (ref 100–199)
HDL: 68 mg/dL (ref >39)
LDL CALC: 196 mg/dL — AB (ref 0–99)
Triglycerides: 149 mg/dL (ref 0–149)
VLDL Cholesterol Cal: 30 mg/dL (ref 5–40)

## 2018-06-15 LAB — COMPREHENSIVE METABOLIC PANEL
ALT: 19 IU/L (ref 0–32)
AST: 17 IU/L (ref 0–40)
Albumin/Globulin Ratio: 1.3 (ref 1.2–2.2)
Albumin: 4.7 g/dL (ref 3.5–5.5)
Alkaline Phosphatase: 105 IU/L (ref 39–117)
BUN/Creatinine Ratio: 14 (ref 9–23)
BUN: 17 mg/dL (ref 6–24)
Bilirubin Total: 0.4 mg/dL (ref 0.0–1.2)
CALCIUM: 9.9 mg/dL (ref 8.7–10.2)
CO2: 24 mmol/L (ref 20–29)
CREATININE: 1.21 mg/dL — AB (ref 0.57–1.00)
Chloride: 98 mmol/L (ref 96–106)
GFR calc non Af Amer: 49 mL/min/{1.73_m2} — ABNORMAL LOW (ref >59)
GFR, EST AFRICAN AMERICAN: 57 mL/min/{1.73_m2} — AB (ref >59)
GLUCOSE: 105 mg/dL — AB (ref 65–99)
Globulin, Total: 3.5 g/dL (ref 1.5–4.5)
Potassium: 4.5 mmol/L (ref 3.5–5.2)
Sodium: 138 mmol/L (ref 134–144)
TOTAL PROTEIN: 8.2 g/dL (ref 6.0–8.5)

## 2018-06-15 LAB — CBC
HEMATOCRIT: 38.5 % (ref 34.0–46.6)
HEMOGLOBIN: 13.1 g/dL (ref 11.1–15.9)
MCH: 30.2 pg (ref 26.6–33.0)
MCHC: 34 g/dL (ref 31.5–35.7)
MCV: 89 fL (ref 79–97)
Platelets: 231 10*3/uL (ref 150–450)
RBC: 4.34 x10E6/uL (ref 3.77–5.28)
RDW: 14.7 % (ref 12.3–15.4)
WBC: 7.8 10*3/uL (ref 3.4–10.8)

## 2018-06-15 LAB — MICROALBUMIN / CREATININE URINE RATIO
Creatinine, Urine: 81.7 mg/dL
Microalb/Creat Ratio: 5.1 mg/g creat (ref 0.0–30.0)
Microalbumin, Urine: 4.2 ug/mL

## 2018-06-15 LAB — TSH: TSH: 93.93 u[IU]/mL — ABNORMAL HIGH (ref 0.450–4.500)

## 2018-06-15 LAB — HEMOGLOBIN A1C
ESTIMATED AVERAGE GLUCOSE: 223 mg/dL
Hgb A1c MFr Bld: 9.4 % — ABNORMAL HIGH (ref 4.8–5.6)

## 2018-06-15 LAB — T4, FREE: Free T4: 0.17 ng/dL — ABNORMAL LOW (ref 0.82–1.77)

## 2018-06-15 MED ORDER — PRAVASTATIN SODIUM 20 MG PO TABS: 20.0000 mg | ORAL_TABLET | Freq: Every evening | ORAL | 0 refills | Status: AC

## 2018-06-15 MED ORDER — LEVOTHYROXINE SODIUM 50 MCG PO TABS: 50.0000 ug | ORAL_TABLET | Freq: Every day | ORAL | 2 refills | Status: AC

## 2018-06-15 MED ORDER — GLIPIZIDE 5 MG PO TABS: 5.0000 mg | ORAL_TABLET | Freq: Two times a day (BID) | ORAL | 1 refills | Status: AC

## 2018-06-17 ENCOUNTER — Telehealth: Payer: Self-pay

## 2018-06-17 NOTE — Telephone Encounter (Signed)
1) this is beyond our control to some extent because her insurance dictates pricing and coverage.  We have NO way to know when we send something whether its $1, $10, or $100.   2) some plans make it such that 90 day supply could actually be cheaper than 30 day.  Again this is dictated by insurer and pharmacy 3)  As I have probably told her in the past, if there ever is an issue regarding costs or coverage, please call ASAP and we can change the prescription.  Which medication needs to be resent as 30 day supply?

## 2018-06-17 NOTE — Telephone Encounter (Signed)
Pt called and stated she is upset because she got 90 day supply on her medication and she does not have the money to pay for it. She stated her money was refunded to her but she has not received it from the pharmacy yet. Pt was informed it could take a few days before she see the refund back on her acct. She stated that shane should have made sure she could afford a 90 days supply. Pt was informed that we are not aware of the price of 90 day because she price she pay is based off of what her insurance is willing to cover. Pt was informed that we could change the prescription and send it back. She state it's no point because she still don't have the money to pay for it and if this happens again she will just  Stop taking all her medications.    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

## 2018-06-23 ENCOUNTER — Encounter: Payer: Self-pay | Admitting: Medical

## 2018-06-23 NOTE — Telephone Encounter (Signed)
Pt is now upset because her glipizide was changed to 5mg  twice daily and she said she was not informed of this. She is now concerned about liver damage because of this. Pt stated if she starts feeling bad she will bring her medicine in and let you have it because she will not be taking it anymore. Pt stated she is very unhappy about this change and not being knowledgeable of it.    Pt has already picked up the 90 day supply meds but she would like them to be changed to 30 days in the future.

## 2018-06-23 NOTE — Telephone Encounter (Signed)
Please mail her the letter I printed today.    Call and let her know that I have sent her a letter that I need her to review.    I did increase Glipizide to BID as she declined to make any of the other changes or pursue recommendations I had listed in the prior lab results phone call.  If I didn't make this clear I apologize.    After reading my letter, she can decide whether to come in and discuss further.

## 2018-06-24 ENCOUNTER — Encounter: Payer: Self-pay | Admitting: Family Medicine

## 2018-06-24 NOTE — Telephone Encounter (Signed)
Called pt reached her voice mail.  The mailbox is full.  The note from Delfino Lovett shows patient knows Glipizide was changed to bid.  Letter has been sent

## 2018-07-01 ENCOUNTER — Telehealth: Payer: Self-pay | Admitting: Family Medicine

## 2018-07-01 NOTE — Telephone Encounter (Signed)
Patient stopped by and wanted to talk for a while.  She was concerned that she wasn't told about the Glipizide being changed to BID and then a 90 day supply.  And why not take a 10 mg instead of two 5mg .  [facility] has a 4.00 plan.  She was concerned that she is not being heard.  She states her A1C dropped from 11 to 9 and no one thought that was good.  She stated Dr Suzette Battiest was prescribing her Thyroid meds and was going to until the refills ran out.  She states she was with out thyroid meds  for 6 months.  She states when she was asked if she was taking Synthroid she would say no, but I never got the clear understanding if she ever said why she wasn't taking it.  She states she is concerned about her diabetes, but she is not willing to take a shot.  And wanted to know if Metformin was her only other option.  She said Janumet had a terrible reaction to her body.  She balled up in a chair and wanted know one to be around her.    We did discuss that we have no way of knowing what her insurance likes, 30 day, 60 day, etc , nor which pharmacies is cheaper and that she can chose not to pick up a medicine due to cost and then call around, etc.

## 2018-07-01 NOTE — Telephone Encounter (Signed)
Sandra Valentine, lets talk about this Friday or Monday whenever you are in the office.    It may be best to have her come in for office visit with me, but have you in the room as well to monitor the conversation since there has been a misperception about things and [place] not seeing eye to eye.   I had already mentioned her history of noncompliance as well.

## 2018-08-18 ENCOUNTER — Other Ambulatory Visit: Payer: Self-pay | Admitting: Medical

## 2018-08-23 ENCOUNTER — Telehealth: Payer: Self-pay | Admitting: Medical

## 2018-08-23 NOTE — Telephone Encounter (Signed)
Pt wants to know what does "antidiabetic" mean that was in the letter that [facility] sent to her. Pt wants to make sure she understands that term.

## 2018-08-25 ENCOUNTER — Telehealth: Payer: Self-pay | Admitting: Medical

## 2018-08-25 NOTE — Telephone Encounter (Signed)
Pt called wanting to know in light of conversations she's had with Beverlee Nims about concerns pt's had about her care in our office, if she will be better suited to be matched with another provider who will have her best in mind

## 2018-08-30 NOTE — Telephone Encounter (Signed)
Sandra Valentine, please see note and advise.

## 2018-08-30 NOTE — Telephone Encounter (Signed)
Yes, I think she should try a different office for better fit.  She has had more than 1 provider here already, and just switching providers internally is not going to probably be helpful.    I recommend she find another office and maybe they can fit better together.

## 2018-08-31 ENCOUNTER — Encounter: Payer: Self-pay | Admitting: Family Medicine

## 2018-09-06 ENCOUNTER — Telehealth: Payer: Self-pay

## 2018-09-06 NOTE — Telephone Encounter (Signed)
Patient called stating she needs to speak to the Glass blower/designer. Informed patient manager was not at her desk but she could leave a VM. Patient did not want to leave a VM. Stated she will call back to have all her records transferred from our office. Stated she did not like the letter she got about being dismissed and that Audelia Acton is trying to do her in by suggesting insulin for diabetes. Stated she no longer wants Audelia Acton in charge of her health anyway and that she wants Dr. Redmond School to be aware of what it going on because it was not her choice to leave. Stated she will callback during normal business hours tomorrow about billing and to speak to Glass blower/designer.

## 2018-09-06 NOTE — Telephone Encounter (Signed)
Please let her know that this is nothing personal, as I like her and her family, I appreciate seeing them over the years.  I actually did discuss her case with Dr. Tomi Bamberger and Dr. Redmond School.  From the years of seeing her, I understand that she does not like to take medication, but she has medical problems that are not controlled despite the fact that she feels okay.  It would be improper for me to just ignore the medical condition she has.  I do not think she realizes how serious some of the medical conditions are and possible complications down the road.  I am not going to be responsible in taking blame for the complications if she is not willing to do anything about them now.  Maybe she would respond better to a different provider, thus this opportunity gives her a chance to establish with someone else that she may have a better fit with  I hope she understands.  I know that all 4 providers here practice similarly, and if she is not willing to treat or be proactive about her current uncontrolled medical problems, there is no reason to just move around to different providers in the office  I wish her well, and hope she will be more apt to adequately address her health concerns moving forward

## 2018-09-06 NOTE — Telephone Encounter (Signed)
Please review

## 2018-09-07 NOTE — Telephone Encounter (Signed)
Returned call to pt, reached Clinical biochemist. One of her comments to me was he rx the wrong med.  Sandra Valentine states she had been out of Synthroid for over 1 year and he was not going to start her out at the dose she was on 100 mcg.  He was going to move her back to that by 50 mcg first.    Sandra Valentine states A1C had come down from 11 to 9.  Much better, not at goal.  He stated when a1c was at 12 that [name] declined taking any meds.

## 2018-11-01 ENCOUNTER — Telehealth: Payer: Self-pay | Admitting: Family Medicine

## 2018-11-01 NOTE — Telephone Encounter (Signed)
Pt called and states she has an appointment with Dr. Nino Glow office tomorrow and would like records faxed.  She will call back in morning with fax number.  I called [facility] to get fax number and they had already closed.

## 2019-05-17 ENCOUNTER — Other Ambulatory Visit: Payer: Self-pay | Admitting: Family Medicine

## 2019-07-27 ENCOUNTER — Other Ambulatory Visit: Payer: Self-pay | Admitting: Family Medicine

## 2019-07-27 DIAGNOSIS — Z1231 Encounter for screening mammogram for malignant neoplasm of breast: Secondary | ICD-10-CM

## 2019-09-09 ENCOUNTER — Ambulatory Visit
Admission: RE | Admit: 2019-09-09 | Discharge: 2019-09-09 | Disposition: A | Discharge status: Home / Self Care | Payer: Managed Care, Other (non HMO) | Source: Ambulatory Visit | Attending: Family Medicine | Admitting: Family Medicine

## 2019-09-09 ENCOUNTER — Other Ambulatory Visit: Payer: Self-pay

## 2019-09-09 DIAGNOSIS — Z1231 Encounter for screening mammogram for malignant neoplasm of breast: Secondary | ICD-10-CM

## 2020-08-02 ENCOUNTER — Other Ambulatory Visit: Payer: Self-pay | Admitting: Family Medicine

## 2020-08-02 DIAGNOSIS — Z1231 Encounter for screening mammogram for malignant neoplasm of breast: Secondary | ICD-10-CM

## 2020-09-12 ENCOUNTER — Other Ambulatory Visit: Payer: Self-pay

## 2020-09-12 ENCOUNTER — Ambulatory Visit
Admission: RE | Admit: 2020-09-12 | Discharge: 2020-09-12 | Disposition: A | Discharge status: Home / Self Care | Payer: Managed Care, Other (non HMO) | Source: Ambulatory Visit | Attending: Family Medicine | Admitting: Family Medicine

## 2020-09-12 DIAGNOSIS — Z1231 Encounter for screening mammogram for malignant neoplasm of breast: Secondary | ICD-10-CM

## 2021-10-18 ENCOUNTER — Other Ambulatory Visit: Payer: Self-pay

## 2021-10-18 ENCOUNTER — Other Ambulatory Visit: Payer: Self-pay | Admitting: Family Medicine

## 2021-10-18 ENCOUNTER — Ambulatory Visit
Admission: RE | Admit: 2021-10-18 | Discharge: 2021-10-18 | Disposition: A | Discharge status: Home / Self Care | Payer: Managed Care, Other (non HMO) | Source: Ambulatory Visit | Attending: Family Medicine | Admitting: Family Medicine

## 2021-10-18 DIAGNOSIS — Z1231 Encounter for screening mammogram for malignant neoplasm of breast: Secondary | ICD-10-CM

## 2022-08-22 ENCOUNTER — Other Ambulatory Visit: Payer: Self-pay | Admitting: Family Medicine

## 2022-08-22 DIAGNOSIS — Z1231 Encounter for screening mammogram for malignant neoplasm of breast: Secondary | ICD-10-CM

## 2022-12-17 ENCOUNTER — Ambulatory Visit
Admission: RE | Admit: 2022-12-17 | Discharge: 2022-12-17 | Disposition: A | Discharge status: Home / Self Care | Payer: Managed Care, Other (non HMO) | Source: Ambulatory Visit | Attending: Family Medicine | Admitting: Family Medicine

## 2022-12-17 DIAGNOSIS — Z1231 Encounter for screening mammogram for malignant neoplasm of breast: Secondary | ICD-10-CM

## 2023-12-08 ENCOUNTER — Other Ambulatory Visit: Payer: Self-pay | Admitting: Family Medicine

## 2023-12-08 DIAGNOSIS — Z1231 Encounter for screening mammogram for malignant neoplasm of breast: Secondary | ICD-10-CM

## 2023-12-22 ENCOUNTER — Ambulatory Visit
Admission: RE | Admit: 2023-12-22 | Discharge: 2023-12-22 | Disposition: A | Discharge status: Home / Self Care | Payer: Managed Care, Other (non HMO) | Source: Ambulatory Visit | Attending: Family Medicine | Admitting: Family Medicine

## 2023-12-22 DIAGNOSIS — Z1231 Encounter for screening mammogram for malignant neoplasm of breast: Secondary | ICD-10-CM

## 2024-05-06 ENCOUNTER — Encounter: Payer: Self-pay | Admitting: Advanced Practice Midwife
